# Patient Record
Sex: Male | Born: 1960
Health system: Southern US, Community
[De-identification: ages and names within clinical notes are randomized; demographics above are authoritative.]

## PROBLEM LIST (undated history)

## (undated) DIAGNOSIS — K219 Gastro-esophageal reflux disease without esophagitis: Secondary | ICD-10-CM

## (undated) DIAGNOSIS — E785 Hyperlipidemia, unspecified: Secondary | ICD-10-CM

## (undated) DIAGNOSIS — A4902 Methicillin resistant Staphylococcus aureus infection, unspecified site: Secondary | ICD-10-CM

## (undated) DIAGNOSIS — G4730 Sleep apnea, unspecified: Secondary | ICD-10-CM

## (undated) DIAGNOSIS — M47812 Spondylosis without myelopathy or radiculopathy, cervical region: Secondary | ICD-10-CM

## (undated) DIAGNOSIS — Z8719 Personal history of other diseases of the digestive system: Secondary | ICD-10-CM

## (undated) DIAGNOSIS — M1990 Unspecified osteoarthritis, unspecified site: Secondary | ICD-10-CM

## (undated) DIAGNOSIS — G629 Polyneuropathy, unspecified: Secondary | ICD-10-CM

## (undated) DIAGNOSIS — K760 Fatty (change of) liver, not elsewhere classified: Secondary | ICD-10-CM

## (undated) DIAGNOSIS — F419 Anxiety disorder, unspecified: Secondary | ICD-10-CM

## (undated) HISTORY — DX: Hyperlipidemia, unspecified: E78.5

## (undated) HISTORY — DX: Fatty (change of) liver, not elsewhere classified: K76.0

## (undated) HISTORY — DX: Polyneuropathy, unspecified: G62.9

## (undated) HISTORY — PX: BACK SURGERY: SHX140

## (undated) HISTORY — PX: GANGLION CYST EXCISION: SHX1691

## (undated) HISTORY — PX: OTHER SURGICAL HISTORY: SHX169

## (undated) HISTORY — DX: Spondylosis without myelopathy or radiculopathy, cervical region: M47.812

---

## 1997-10-11 ENCOUNTER — Ambulatory Visit (HOSPITAL_BASED_OUTPATIENT_CLINIC_OR_DEPARTMENT_OTHER): Admission: RE | Admit: 1997-10-11 | Discharge: 1997-10-11 | Payer: Self-pay | Admitting: Orthopedic Surgery

## 1998-01-08 ENCOUNTER — Ambulatory Visit (HOSPITAL_BASED_OUTPATIENT_CLINIC_OR_DEPARTMENT_OTHER): Admission: RE | Admit: 1998-01-08 | Discharge: 1998-01-08 | Payer: Self-pay | Admitting: Surgery

## 2001-12-30 ENCOUNTER — Encounter: Payer: Self-pay | Admitting: Internal Medicine

## 2001-12-30 ENCOUNTER — Ambulatory Visit (HOSPITAL_COMMUNITY): Admission: RE | Admit: 2001-12-30 | Discharge: 2001-12-30 | Payer: Self-pay | Admitting: Internal Medicine

## 2002-04-05 ENCOUNTER — Inpatient Hospital Stay (HOSPITAL_COMMUNITY): Admission: EM | Admit: 2002-04-05 | Discharge: 2002-04-06 | Payer: Self-pay

## 2002-04-06 ENCOUNTER — Encounter: Payer: Self-pay | Admitting: Internal Medicine

## 2003-10-08 ENCOUNTER — Ambulatory Visit (HOSPITAL_COMMUNITY): Admission: RE | Admit: 2003-10-08 | Discharge: 2003-10-08 | Payer: Self-pay | Admitting: *Deleted

## 2003-10-16 ENCOUNTER — Ambulatory Visit (HOSPITAL_COMMUNITY): Admission: RE | Admit: 2003-10-16 | Discharge: 2003-10-16 | Payer: Self-pay | Admitting: *Deleted

## 2005-01-27 ENCOUNTER — Emergency Department (HOSPITAL_COMMUNITY): Admission: EM | Admit: 2005-01-27 | Discharge: 2005-01-27 | Payer: Self-pay | Admitting: Family Medicine

## 2005-05-27 ENCOUNTER — Emergency Department (HOSPITAL_COMMUNITY): Admission: EM | Admit: 2005-05-27 | Discharge: 2005-05-27 | Payer: Self-pay | Admitting: Family Medicine

## 2005-06-10 ENCOUNTER — Emergency Department (HOSPITAL_COMMUNITY): Admission: EM | Admit: 2005-06-10 | Discharge: 2005-06-10 | Payer: Self-pay | Admitting: Family Medicine

## 2005-06-30 ENCOUNTER — Ambulatory Visit: Payer: Self-pay | Admitting: Infectious Diseases

## 2006-07-28 ENCOUNTER — Ambulatory Visit (HOSPITAL_COMMUNITY): Admission: RE | Admit: 2006-07-28 | Discharge: 2006-07-29 | Payer: Self-pay | Admitting: Neurosurgery

## 2007-04-13 ENCOUNTER — Ambulatory Visit (HOSPITAL_COMMUNITY): Admission: RE | Admit: 2007-04-13 | Discharge: 2007-04-13 | Payer: Self-pay | Admitting: Gastroenterology

## 2007-05-06 ENCOUNTER — Encounter: Admission: RE | Admit: 2007-05-06 | Discharge: 2007-05-06 | Payer: Self-pay | Admitting: Neurosurgery

## 2007-05-07 ENCOUNTER — Encounter: Admission: RE | Admit: 2007-05-07 | Discharge: 2007-05-07 | Payer: Self-pay | Admitting: Neurosurgery

## 2007-06-28 ENCOUNTER — Emergency Department (HOSPITAL_COMMUNITY): Admission: EM | Admit: 2007-06-28 | Discharge: 2007-06-28 | Payer: Self-pay | Admitting: Emergency Medicine

## 2007-06-29 ENCOUNTER — Emergency Department (HOSPITAL_COMMUNITY): Admission: EM | Admit: 2007-06-29 | Discharge: 2007-06-29 | Payer: Self-pay | Admitting: Emergency Medicine

## 2007-11-19 ENCOUNTER — Emergency Department (HOSPITAL_COMMUNITY): Admission: EM | Admit: 2007-11-19 | Discharge: 2007-11-19 | Payer: Self-pay | Admitting: Emergency Medicine

## 2007-12-13 ENCOUNTER — Encounter: Admission: RE | Admit: 2007-12-13 | Discharge: 2007-12-13 | Payer: Self-pay | Admitting: Neurosurgery

## 2008-01-02 ENCOUNTER — Encounter: Admission: RE | Admit: 2008-01-02 | Discharge: 2008-01-02 | Payer: Self-pay | Admitting: Neurosurgery

## 2008-03-14 ENCOUNTER — Ambulatory Visit (HOSPITAL_COMMUNITY): Admission: RE | Admit: 2008-03-14 | Discharge: 2008-03-15 | Payer: Self-pay | Admitting: Neurosurgery

## 2008-07-27 ENCOUNTER — Encounter: Admission: RE | Admit: 2008-07-27 | Discharge: 2008-07-27 | Payer: Self-pay | Admitting: Neurosurgery

## 2008-07-28 ENCOUNTER — Encounter: Admission: RE | Admit: 2008-07-28 | Discharge: 2008-07-28 | Payer: Self-pay | Admitting: Neurosurgery

## 2008-12-12 ENCOUNTER — Encounter: Admission: RE | Admit: 2008-12-12 | Discharge: 2008-12-12 | Payer: Self-pay | Admitting: Neurosurgery

## 2010-05-27 ENCOUNTER — Ambulatory Visit (HOSPITAL_COMMUNITY)
Admission: RE | Admit: 2010-05-27 | Discharge: 2010-05-27 | Payer: Self-pay | Source: Home / Self Care | Attending: Gastroenterology | Admitting: Gastroenterology

## 2010-10-14 NOTE — Op Note (Signed)
NAMEDVANTE, HANDS                ACCOUNT NO.:  [id number]   MEDICAL RECORD NO.:  [id number]          PATIENT TYPE:  AMB   LOCATION:  ENDO                         FACILITY:  [facility]   PHYSICIAN:  Danise Edge, M.D.   DATE OF BIRTH:  September 12, 1960   DATE OF PROCEDURE:  04/13/2007  DATE OF DISCHARGE:                               OPERATIVE REPORT   PROCEDURE PERFORMED:  Esophagogastroduodenoscopy.   PROCEDURE INDICATIONS:  Mr. [name] is a 50 year old male born  1960/11/12.  [name] has chronic heartburn.  He is currently  taking Protonix 40 mg each morning.  Despite taking Protonix each  morning, he occasionally has breakthrough heartburn and has  regurgitation.  He reports no dysphagia or odynophagia.   MEDICATIONS ALLERGIES:  VOLTAREN.   CHRONIC MEDICATIONS:  Protonix, multivitamin, calcium, Xanax.   PAST MEDICAL/SURGICAL HISTORY:  1. Gastroesophageal reflux.  2. Atypical chest pain with normal Cardiolite stress test 2003.  3. History of seizure 20 years ago after trauma.  4. Mild dyslipidemia.  5. Chronic headaches.  6. Cervical disk disease.  7. C5-C6 diskectomy.  8. Right ear surgery.  9. Varicocele surgery.  10.Ganglion cyst removed from the left wrist.  11.Lipoma.  12.Benign moles.  13.Ulnar nerve surgery on the left arm.   ENDOSCOPIST:  Danise Edge, M.D.   PREMEDICATION:  Fentanyl 100 mcg, Versed 7.5 mg.   PROCEDURE:  After obtaining informed consent, [name] was placed in  the left lateral decubitus position.  I administered intravenous  fentanyl and intravenous Versed to achieve conscious sedation for the  procedure.  The patient's blood pressure, oxygen saturation and cardiac  rhythm were monitored throughout the procedure and documented in the  medical record.   The Pentax gastroscope was passed through the posterior hypopharynx into  the proximal esophagus without difficulty.  The hypopharynx, larynx and  vocal cords appeared normal.   Esophagoscopy:  The proximal mid and lower segments of the esophageal  mucosa appeared normal.  [name] has a small hiatal hernia.  The  esophagogastric junction is noted at approximately 36 cm from the  incisor teeth.  There is no endoscopic evidence for the presence of  Barrett's esophagus, erosive esophagitis or esophageal mucosal scarring.   Gastroscopy:  Retroflex view of the gastric cardia and fundus was  normal.  The diaphragmatic hiatus was slightly patulous.  The gastric  body, antrum and pylorus appear normal.   Duodenoscopy:  The duodenal bulb and descending duodenum appeared  normal.   ASSESSMENT:  Normal esophagogastroduodenoscopy except for the presence  of a small hiatal hernia and slightly patulous diaphragmatic hiatus.   RECOMMENDATIONS:  [name] will try taking his Protonix before  breakfast and supper to control his reflux symptoms.  He is not  interested in acid reflux surgery to control his regurgitation.           ______________________________  Danise Edge, M.D.     MJ/MEDQ  D:  04/13/2007  T:  04/14/2007  Job:  [id number]   cc:   Georgann Housekeeper, MD

## 2010-10-14 NOTE — Op Note (Signed)
NAMEJAYR, [name]                ACCOUNT NO.:  [id number]   MEDICAL RECORD NO.:  [id number]          PATIENT TYPE:  OIB   LOCATION:  3528                         FACILITY:  [facility]   PHYSICIAN:  Hewitt Shorts, M.D.DATE OF BIRTH:  [birth year]   DATE OF PROCEDURE:  03/14/2008  DATE OF DISCHARGE:                               OPERATIVE REPORT   PREOPERATIVE DIAGNOSES:  1. C5-C6, C6-C7 pseudoarthrosis.  2. Neck pain.   POSTOPERATIVE DIAGNOSES:  1. C5-C6, C6-C7 pseudoarthrosis.  2. Neck pain.   PROCEDURE:  C5-C7 posterior cervical arthrodesis with lateral mass  screws and rods and bone graft and Infuse.   SURGEON:  Hewitt Shorts, MD   ASSISTANT:  Coletta Memos, MD   ANESTHESIA:  General endotracheal.   INDICATIONS:  The patient is a 50 year old man.  He is status post 2  level C5-6 and C6-7 anterior cervical decompression arthrodesis in  February 2008.  Unfortunately, he developed nonunion of both C5-6 and C6-  7 levels.  These were treated with calcium with vitamin D as well as  Miacalcin nasal spray without improvement and a decision was made to  proceed with a posterior cervical arthrodesis.   PROCEDURE:  The patient was brought to the operating room and placed  under general endotracheal anesthesia.  The patient had a 3-pin Mayfield  head holder applied.  The patient was turned to a prone position.  The  posterior cervical region was prepped with Betadine soap and solution  and draped in a sterile fashion.  The midline was infiltrated with local  anesthetic with epinephrine and the C6 and C7 spinous process was  identified by palpation and a midline incision was made over the lower  cervical region and carried down through the subcutaneous tissue.  Bipolar cautery and electrocautery used to maintain hemostasis.  Dissection was carried down to the cervical fascia, which was incised  bilaterally and the paracervical musculature was dissected from the  spinous process  and lamina in a subperiosteal fashion.  A self-retaining  retractor was placed, and a C-arm fluoroscope was draped and brought  onto the field to provide additional localization.  Even with the C-arm  fluoroscope the patient's shoulders did somewhat obscure the overall  visualization, but we were able to count down from C2 and identify the  C5, C6, and C7 spinous processes and there by the lamina and facets and  lateral masses.  Using C-arm fluoroscopic guidance, we then placed the  lateral mass screws taking entry sites bilaterally at each level.  The  entry site was stated with an awl and then we used a 2.5-mm drill bit,  each drill hole into the lateral mass was examined with a ball probe and  good bony surfaces were found.  Each screw hole was tapped with tap and  then we placed 3.5 mm screws placing 12-mm screws at C5 and 14-mm screws  at C6 and C7.  We used an Oasis lateral mass screw and rod set.  We used  a 50-mm rod on the right and a 40-mm rod on the left.  Each  was lordosed  with the rod bender and placed within the screw heads.  Locking caps  were placed and they were tightened against the counter torque.  We then  decorticated the facet joints and lamina of C5, C6, and C7 and using a  small Infuse, a pledget was placed on each side over the decorticated  bony surface and then we  used mosaic, which was packed into the facet  joints and over the lamina from C5 to C7.  We then proceeded with  closure.  The deep fascia closed with interrupted undyed 1-0 Vicryl  sutures.  Scarpa fascia was closed with interrupted undyed #1 Vicryl  sutures.  The subcutaneous and subcuticular were closed with interrupted  inverted 2-0 undyed Vicryl sutures.  The skin was closed with surgical  staples.  The wound was dressed with Adaptic and sterile gauze.  The  procedure was tolerated well.  The estimated blood loss was 100 mL.  Sponge and needle count were correct.  Following surgery, the patient   was returned back to supine position.  The 3-pin Mayfield head holder  was removed.  The patient was then reversed from the anesthetic,  extubated, and transferred to the recovery room for further care where  he was noted moving all 4 extremities to command.      Hewitt Shorts, M.D.  Electronically Signed     RWN/MEDQ  D:  03/14/2008  T:  03/15/2008  Job:  [id number]

## 2010-10-17 NOTE — Op Note (Signed)
NAMEORLANDO, DEVEREUX                ACCOUNT NO.:  [id number]   MEDICAL RECORD NO.:  [id number]          PATIENT TYPE:  AMB   LOCATION:  SDS                          FACILITY:  [facility]   PHYSICIAN:  Hewitt Shorts, M.D.DATE OF BIRTH:  1960/12/09   DATE OF PROCEDURE:  07/28/2006  DATE OF DISCHARGE:                               OPERATIVE REPORT   PREOPERATIVE DIAGNOSES:  C5-6 and C6-7 cervical disk herniation,  cervical spondylosis, cervical degenerative disk disease, and cervical  radiculopathy.   POSTOPERATIVE DIAGNOSIS:  C5-6 and C6-7 cervical disk herniation,  cervical spondylosis, cervical degenerative disk disease, and cervical  radiculopathy.   PROCEDURE:  C5-6 and C6-7 anterior cervical discectomy and arthrodesis  with allograft and Tether cervical plating.   SURGEON:  Hewitt Shorts, M.D.   ASSISTANT:  Jerald Kief, RN and Phoebe Perch.   ANESTHESIA:  General endotracheal.   INDICATION:  The patient is a 50 year old man who presented with left  cervical radiculopathy.  He was found by x-rays and MRI scan to have  degenerative disk disease and spondylosis.  MRI revealed superimposed  spondylosis with cervical disk herniation.  The decision was made to  proceed with a two-level anterior cervical discectomy and arthrodesis.   PROCEDURE:  The patient brought to the operating room and placed under  general endotracheal anesthesia.  The patient was placed in 10 pounds of  Holter traction and the neck was prepped with Betadine and soap solution  and draped in a sterile fashion.  A horizontal incision was made in the  left side of the neck.  The line of the incision was infiltrated with  local anesthetic with epinephrine.  Dissection was carried down through  the subcutaneous tissue and platysma.  Bipolar cautery was used to  maintain hemostasis.  Dissection was then carried out through an  avascular plane leaving the sternocleidomastoid, carotid artery, and  jugular vein laterally,  and the trachea and esophagus medially.  The  ventral aspect of the vertebral column was identified and a localizing x-  ray taken and the C5-6 and C6-7 intervertebral disk space was  identified.  Discectomy was begun with incision of the annulus and  continued with micro curets and pituitary rongeurs.  The microscope was  draped and brought into the field to provide additional magnification,  illumination, and visualization.  The remainder of decompression was  performed using microdissection microsurgical technique.  Anterior  osteophytic overgrowth was removed using the 3-mm Kerrison punch, as  well as the osteophyte removal tool.  The discectomy was continued  posteriorly through the disk space at each level with removal of the  cartilaginous end plates using micro curets, along with the XMax drill  and then posterior osteophytic overgrowth was removed using the XMax  drill and Kerrison punch with a thin footplate.  The posterior  longitudinal ligament was carefully opened at each level and removed and  then disk herniation was removed and attention was then paid to the  neural foramina laterally at each level.  This spondylitic overgrowth  and disk herniation was removed and we were able to decompress the  exiting nerve roots at each level bilaterally.  Once the decompression  was completed, hemostasis was established with the use of Gelfoam soaked  in thrombin and then we measured the height of the intervertebral disk  space and selected two 7-mm intervertebral implants.  Each was hydrated  in saline solution and positioned in the intervertebral disk space and  countersunk.  Once each graft was in place, the traction was  discontinued and then we selected a 32-mm Tether cervical plate that was  positioned over the fusion construct and secured to the vertebra with 4  x 14-mm variable angle screws, placing a pair of screws at C5, another  pair at C7, and a single screw at C6.  Each  screw hole was drilled and  tapped and the screws were placed in an alternating fashion.  Once all 5  screws were placed, the final tightening was performed.  We then took an  x-ray.  Unfortunately, because of his large shoulders, visualization of  the construct was limited.  Screws were in good position at C5, graft  appeared in good position at the C5-6 level.  However, under direct  visualization, the implants looked in good position.  The wound was  irrigated with bacitracin solution, checked for hemostasis which was  established and then we proceeded with closure.  The platysma was closed  with interrupted inverted 2-0 Vicryl suture.  The subcutaneous and  subcuticular were closed with interrupted inverted 3-0 Vicryl sutures.  The skin was reapproximated with Dermabond.  The procedure was tolerated  well.  The estimated blood loss was 50 mL.  Sponge and needle count were  correct.  Following surgery, the patient is to be reversed from  anesthetic, extubated, and transferred to the recovery room for further  care.      Hewitt Shorts, M.D.  Electronically Signed     RWN/MEDQ  D:  07/28/2006  T:  07/28/2006  Job:  [id number]

## 2010-10-17 NOTE — Consult Note (Signed)
[name], [name]                          ACCOUNT NO.:  [id number]   MEDICAL RECORD NO.:  [id number]                   PATIENT TYPE:  INP   LOCATION:  2002                                 FACILITY:  [facility]   PHYSICIAN:  Meade Maw, M.D.                 DATE OF BIRTH:  1960-06-18   DATE OF CONSULTATION:  04/05/2002  DATE OF DISCHARGE:                                   CONSULTATION   REASON FOR CONSULTATION:  Chest pain.   HISTORY OF PRESENT ILLNESS:  The patient is a 50 year old gentleman  previously evaluated by Dr. Amil Amen in 1997 for chest pain.  He had an  abnormal GX2, which was subsequently followed by an adenosine Cardiolite.  The patient notes a six-month history of left anterior sharp transient chest  pain.  There have been no aggravating or alleviating factors noted.  The  patient notes that the chest pain was fleeting in sensation.  Yesterday he  noted a different sort of chest pain, which was described as a slashing  chest pain.  The pain recurred today, and the patient felt totally exhausted  and fatigued.  He had nausea but no vomiting and positive diaphoresis.  He  felt that this pain was different from his usual GERD symptoms and  subsequently presented to the emergency room for further evaluation.   PAST MEDICAL HISTORY:  1. GERD.  He has had a negative upper GI in the past.  2. Chest pain October 1997.  Adenosine Cardiolite negative.  3. Dyslipidemia.  4. History of seizure disorder following a head trauma.  5. Frequent headaches with a negative MRI in the past.   PAST SURGICAL HISTORY:  Significant for ear surgery.   MEDICATIONS:  Current medications include Prilosec 20 mg daily,  multivitamin, and Excedrin p.r.n.  His current medications include Protonix  40 mg daily, aspirin 325 mg daily, Tylenol p.r.n., Ambien 5 mg p.r.n.,  sublingual nitroglycerin p.r.n.   ALLERGIES:  VOLTAREN.   SOCIAL HISTORY:  Continues to smoke two packs per day.  Twelve  beers per  week.   FAMILY HISTORY:  Significant for Father passing with myocardial infarction  at age 63.  Mother passed at age 64 from congestive heart failure.   REVIEW OF SYSTEMS:  He notes episodic dizziness.  There has been no  dysphagia.  There has been no pedal edema, no orthopnea, no  tachyarrhythmias.   PHYSICAL EXAMINATION:  VITAL SIGNS:  Blood pressure is 134/81, heart rate  70.  He is afebrile.  HEENT:  Unremarkable.  NECK:  He has good carotid upstrokes.  No carotid bruit is noted.  No neck  vein distention is noted.  CHEST:  Bibasilar crackles.  Pulmonary exam otherwise clear to auscultation.  CARDIAC:  Regular rate and rhythm, no rubs, murmurs, or gallops noted.  ABDOMEN:  Soft, benign, nontender.  No bruits are noted.  EXTREMITIES:  Distal  pulses which are equal and intact.  NEUROLOGIC:  Nonfocal.  Motor is 5/5 throughout.   LABORATORY DATA:  Chest x-ray reveals no acute disease.  ECG reveals J-point  elevation consistent with early repolarization.   His white count is 6.5, hematocrit 46, platelet count is 177.  Creatinine  0.9.  Initial cardiac enzymes are negative.   IMPRESSION:  [name] with atypical chest pain but  significant risk factors, including family history, tobacco use.  Will  proceed with a stress Cardiolite for further evaluation.  Would continue  with aspirin 325 mg daily.  Should he have recurrent chest pain, would  consider initiation of heparin or Lovenox.  As he has had no EKG changes and  his initial CKs are negative, will hold on anticoagulation at this point.                                               Meade Maw, M.D.    HP/MEDQ  D:  04/05/2002  T:  04/06/2002  Job:  [id number]   cc:   Georgann Housekeeper, M.D.  [address]  [place]  [place] [address]  Fax: [phone]   Francisca December, M.D.  [address]  [place]  [place] [address]  Fax: [phone]

## 2010-10-17 NOTE — H&P (Signed)
[name], [name]                            ACCOUNT NO.:  [id number]   MEDICAL RECORD NO.:  [id number]                  PATIENT TYPE:   LOCATION:                                       FACILITY:   PHYSICIAN:  Georgann Housekeeper, M.D.                 DATE OF BIRTH:  January 07, 1961   DATE OF ADMISSION:  04/05/2002  DATE OF DISCHARGE:                                HISTORY & PHYSICAL   CHIEF COMPLAINT:  Chest pain.   HISTORY OF PRESENT ILLNESS:  This 50 year old male with history of  gastroesophageal reflux disease and tobacco use comes in complaining of this  substernal chest pain. Describes as sharp and pulse-like.  Happened this  morning about 8/10.  Episodes last a few seconds 10 to 12 times.  It was  while walking.  After that he felt flushed and a little sweaty and some  shortness of breath.  He came to the emergency room.  He did not have any  pain here.  No palpitations.  He also reports that two days ago he had chest  pain across his chest while lying watching TV, but he did not have any  shortness of breath.  His left arm and shoulder have been aching since last  night. Some nausea.  No vomiting.  No fevers. No cough.  No URI symptoms.  He states that his symptoms with this has been a little different than his  usual acid reflux symptoms which seems to be well controlled with Prilosec  although he said that since he has been taking the over-the-counter Prilosec  he feels that it is not holding 24 hours which he takes daily.   PAST MEDICAL HISTORY:  1. Acid reflux disease.  2. Atypical chest pain 9/97  with stress test Cardiolite negative.   MEDICATIONS:  1. Prilosec over-the-counter 20 mg.  2. Vitamins.   ALLERGIES:  VOLTAREN.   PAST SURGICAL HISTORY:  None.   SOCIAL HISTORY:  He smokes two pack a day for 25 years.  Alcohol:  None.  No  recreational drugs. Caffeine:  One to two per day.   FAMILY HISTORY:  Father died of MI at age 68. Mother died of emphysema and  CHF  in her 70s.  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  Married.   REVIEW OF SYSTEMS:  As above.   PHYSICAL EXAMINATION:  VITAL SIGNS:  Temperature 97, blood pressure 117/80,  pulse 60.  Saturation 100%.  GENERAL:  Well-appearing male in no acute distress.  He denies any chest  pain or difficulty breathing.  HEENT:  Pupils reactive.  Oropharynx was clear.  NECK:  Supple.  CARDIAC:  Regular rhythm S1, S2 without any murmurs or gallops.  LUNGS:  Clear.  ABDOMEN:  Soft without any tenderness.  Positive bowel sounds.  EXTREMITIES:  No edema.   Chest x-ray  was negative.  EKG normal sinus rhythm without any STT wave  changes.   LABORATORY DATA:  CPK of 51, troponin 0.01. MB was normal.  White  count  6.5, hemoglobin 15.  Chemistries are normal with glucose 97.   ASSESSMENT:  A 50 year old male with chest pain symptoms, atypical.  History  of gastroesophageal reflux disease.  Positive risk factors.  Family history  as well as tobacco.  Stress test six years ago was negative.   PLAN:  Admit for telemetry.  Rule myocardial infarction out and get a stress  test with cardiology.  I would hold on nitroglycerin at  this point because  he has no pain.  Continue with aspirin.  As far as acid reflux disease,  Protonix 40 mg q.d.                                               Georgann Housekeeper, M.D.    KH/MEDQ  D:  04/05/2002  T:  04/05/2002  Job:  [id number]

## 2010-10-20 ENCOUNTER — Other Ambulatory Visit (HOSPITAL_COMMUNITY): Payer: Self-pay | Admitting: Neurosurgery

## 2010-10-20 DIAGNOSIS — M542 Cervicalgia: Secondary | ICD-10-CM

## 2010-11-14 ENCOUNTER — Ambulatory Visit (HOSPITAL_COMMUNITY)
Admission: RE | Admit: 2010-11-14 | Discharge: 2010-11-14 | Disposition: A | Discharge status: Home / Self Care | Payer: 59 | Source: Ambulatory Visit | Attending: Neurosurgery | Admitting: Neurosurgery

## 2010-11-14 DIAGNOSIS — M4802 Spinal stenosis, cervical region: Secondary | ICD-10-CM | POA: Insufficient documentation

## 2010-11-14 DIAGNOSIS — M542 Cervicalgia: Secondary | ICD-10-CM

## 2010-11-14 DIAGNOSIS — Z01818 Encounter for other preprocedural examination: Secondary | ICD-10-CM | POA: Insufficient documentation

## 2011-02-10 ENCOUNTER — Ambulatory Visit (HOSPITAL_COMMUNITY)
Admission: RE | Admit: 2011-02-10 | Discharge: 2011-02-10 | Disposition: A | Discharge status: Home / Self Care | Payer: 59 | Source: Ambulatory Visit | Attending: Interventional Cardiology | Admitting: Interventional Cardiology

## 2011-02-10 DIAGNOSIS — R072 Precordial pain: Secondary | ICD-10-CM | POA: Insufficient documentation

## 2011-02-19 LAB — URINE MICROSCOPIC-ADD ON

## 2011-02-19 LAB — URINALYSIS, ROUTINE W REFLEX MICROSCOPIC
Glucose, UA: NEGATIVE
Leukocytes, UA: NEGATIVE
Leukocytes, UA: NEGATIVE
Nitrite: NEGATIVE
Specific Gravity, Urine: 1.01
Urobilinogen, UA: 0.2
Urobilinogen, UA: 0.2

## 2011-02-19 LAB — BASIC METABOLIC PANEL
Calcium: 8.9
Chloride: 105
Creatinine, Ser: 1.06
GFR calc Af Amer: >60
GFR calc non Af Amer: >60

## 2011-02-19 LAB — DIFFERENTIAL
Lymphocytes Relative: 13
Lymphs Abs: 1.1
Monocytes Relative: 6
Neutro Abs: 7.1
Neutrophils Relative %: 80 — ABNORMAL HIGH

## 2011-02-19 LAB — CBC
RBC: 4.48
WBC: 8.9

## 2011-03-02 LAB — CBC
HCT: 46
Hemoglobin: 15.6
MCHC: 34
MCV: 94.3
Platelets: 211
RBC: 4.87
RDW: 12.8
WBC: 6.1

## 2011-06-08 ENCOUNTER — Other Ambulatory Visit: Payer: Self-pay | Admitting: Internal Medicine

## 2011-06-08 DIAGNOSIS — R1011 Right upper quadrant pain: Secondary | ICD-10-CM

## 2011-06-09 ENCOUNTER — Other Ambulatory Visit: Payer: Self-pay | Admitting: Internal Medicine

## 2011-06-09 ENCOUNTER — Ambulatory Visit
Admission: RE | Admit: 2011-06-09 | Discharge: 2011-06-09 | Disposition: A | Discharge status: Home / Self Care | Payer: 59 | Source: Ambulatory Visit | Attending: Internal Medicine | Admitting: Internal Medicine

## 2011-06-09 DIAGNOSIS — R1011 Right upper quadrant pain: Secondary | ICD-10-CM

## 2011-08-10 ENCOUNTER — Encounter (HOSPITAL_COMMUNITY): Payer: Self-pay

## 2011-08-10 ENCOUNTER — Ambulatory Visit (HOSPITAL_COMMUNITY)
Admission: RE | Admit: 2011-08-10 | Discharge: 2011-08-10 | Disposition: A | Discharge status: Home / Self Care | Payer: 59 | Source: Ambulatory Visit | Attending: Gastroenterology | Admitting: Gastroenterology

## 2011-08-10 HISTORY — DX: Unspecified osteoarthritis, unspecified site: M19.90

## 2011-08-10 HISTORY — DX: Sleep apnea, unspecified: G47.30

## 2011-08-10 HISTORY — DX: Gastro-esophageal reflux disease without esophagitis: K21.9

## 2011-08-10 HISTORY — DX: Personal history of other diseases of the digestive system: Z87.19

## 2011-08-10 HISTORY — DX: Anxiety disorder, unspecified: F41.9

## 2011-08-10 HISTORY — DX: Methicillin resistant Staphylococcus aureus infection, unspecified site: A49.02

## 2011-08-10 NOTE — Pre-Procedure Instructions (Signed)
Patient instructed to take protonix ,pepcid and xanax with sip of water in early am morning of procedure.

## 2011-08-10 NOTE — Anesthesia Preprocedure Evaluation (Addendum)
Anesthesia Evaluation  Patient identified by MRN, date of birth, ID band Patient awake  General Assessment Comment:S/p bilateral neck fusion Pt denies hx of MH  Reviewed: Allergy & Precautions, H&P , NPO status , Patient's Chart, lab work & pertinent test results, reviewed documented beta blocker date and time   Airway Mallampati: II TM Distance: >3 FB Neck ROM: Limited    Dental  (+) Teeth Intact   Pulmonary sleep apnea ,  OSA?, didn't go thru sleep study, uses external nasal "strip". Pt says strip helps and he uses it nightly breath sounds clear to auscultation        Cardiovascular negative cardio ROS  Rhythm:Regular Rate:Normal  Denies cardiac symptoms   Neuro/Psych negative neurological ROS  negative psych ROS   GI/Hepatic negative GI ROS, Neg liver ROS, hiatal hernia, GERD-  ,ABD pain   Endo/Other  negative endocrine ROS  Renal/GU negative Renal ROS  negative genitourinary   Musculoskeletal negative musculoskeletal ROS (+)   Abdominal   Peds negative pediatric ROS (+)  Hematology negative hematology ROS (+)   Anesthesia Other Findings   Reproductive/Obstetrics negative OB ROS                          Anesthesia Physical Anesthesia Plan  ASA: II  Anesthesia Plan: MAC   Post-op Pain Management:    Induction: Intravenous  Airway Management Planned: Mask  Additional Equipment:   Intra-op Plan:   Post-operative Plan:   Informed Consent:   Dental advisory given  Plan Discussed with:   Anesthesia Plan Comments:         Anesthesia Quick Evaluation

## 2011-08-11 ENCOUNTER — Encounter (HOSPITAL_COMMUNITY): Payer: Self-pay | Admitting: Anesthesiology

## 2011-08-11 ENCOUNTER — Ambulatory Visit (HOSPITAL_COMMUNITY): Payer: 59 | Admitting: Anesthesiology

## 2011-08-11 ENCOUNTER — Ambulatory Visit (HOSPITAL_COMMUNITY)
Admission: RE | Admit: 2011-08-11 | Discharge: 2011-08-11 | Disposition: A | Discharge status: Home / Self Care | Payer: 59 | Source: Ambulatory Visit | Attending: Gastroenterology | Admitting: Gastroenterology

## 2011-08-11 ENCOUNTER — Encounter (HOSPITAL_COMMUNITY)
Admission: RE | Disposition: A | Discharge status: Home / Self Care | Payer: Self-pay | Source: Ambulatory Visit | Attending: Gastroenterology

## 2011-08-11 ENCOUNTER — Encounter (HOSPITAL_COMMUNITY): Payer: Self-pay | Admitting: *Deleted

## 2011-08-11 DIAGNOSIS — K449 Diaphragmatic hernia without obstruction or gangrene: Secondary | ICD-10-CM | POA: Insufficient documentation

## 2011-08-11 DIAGNOSIS — M47812 Spondylosis without myelopathy or radiculopathy, cervical region: Secondary | ICD-10-CM | POA: Insufficient documentation

## 2011-08-11 DIAGNOSIS — K208 Other esophagitis without bleeding: Secondary | ICD-10-CM | POA: Insufficient documentation

## 2011-08-11 DIAGNOSIS — K2970 Gastritis, unspecified, without bleeding: Secondary | ICD-10-CM | POA: Insufficient documentation

## 2011-08-11 DIAGNOSIS — M19049 Primary osteoarthritis, unspecified hand: Secondary | ICD-10-CM | POA: Insufficient documentation

## 2011-08-11 DIAGNOSIS — R51 Headache: Secondary | ICD-10-CM | POA: Insufficient documentation

## 2011-08-11 DIAGNOSIS — E785 Hyperlipidemia, unspecified: Secondary | ICD-10-CM | POA: Insufficient documentation

## 2011-08-11 SURGERY — ESOPHAGOGASTRODUODENOSCOPY (EGD) WITH PROPOFOL
Anesthesia: Monitor Anesthesia Care

## 2011-08-11 MED ORDER — PROPOFOL 10 MG/ML IV EMUL
INTRAVENOUS | Status: DC | PRN
  Administered 2011-08-11: 40 mg via INTRAVENOUS
  Administered 2011-08-11: 20 mg via INTRAVENOUS
  Administered 2011-08-11: 30 mg via INTRAVENOUS

## 2011-08-11 MED ORDER — FENTANYL CITRATE 0.05 MG/ML IJ SOLN
INTRAMUSCULAR | Status: DC | PRN
  Administered 2011-08-11: 50 ug via INTRAVENOUS

## 2011-08-11 MED ORDER — BUTAMBEN-TETRACAINE-BENZOCAINE 2-2-14 % EX AERO
INHALATION_SPRAY | CUTANEOUS | Status: DC | PRN
  Administered 2011-08-11: 2 via TOPICAL

## 2011-08-11 MED ORDER — MIDAZOLAM HCL 5 MG/5ML IJ SOLN
INTRAMUSCULAR | Status: DC | PRN
  Administered 2011-08-11: 1 mg via INTRAVENOUS

## 2011-08-11 MED ORDER — LACTATED RINGERS IV SOLN
INTRAVENOUS | Status: DC
  Administered 2011-08-11: 1000 mL via INTRAVENOUS

## 2011-08-11 MED ORDER — PROPOFOL 10 MG/ML IV EMUL
INTRAVENOUS | Status: DC | PRN
  Administered 2011-08-11: 50 ug/kg/min via INTRAVENOUS

## 2011-08-11 SURGICAL SUPPLY — 15 items

## 2011-08-11 NOTE — Transfer of Care (Signed)
Immediate Anesthesia Transfer of Care Note  Patient: [name]  Procedure(s) Performed: Procedure(s) (LRB): ESOPHAGOGASTRODUODENOSCOPY (EGD) WITH PROPOFOL (N/A)  Patient Location: Endoscopy Unit  Anesthesia Type: MAC  Level of Consciousness: awake, alert  and oriented  Airway & Oxygen Therapy: Patient Spontanous Breathing and Patient connected to nasal cannula oxygen  Post-op Assessment: Report given to PACU RN and Post -op Vital signs reviewed and stable  Post vital signs: Reviewed and stable  Complications: No apparent anesthesia complications

## 2011-08-11 NOTE — Op Note (Signed)
[facility] [address] [place], [place]  [address]  ENDOSCOPY PROCEDURE REPORT  PATIENT:  [name], [name]  MR#:  [id number] BIRTHDATE:  24-May-1961, 50 yrs. old  GENDER:  male  ENDOSCOPIST:  Willis Modena, MD Referred by:  Georgann Housekeeper, M.D.  PROCEDURE DATE:  08/11/2011 PROCEDURE:  EGD with biopsy, [id number] ASA CLASS:  Class II INDICATIONS:  abdominal pain, GERD  MEDICATIONS:  Cetacaine spray x 2, MAC sedation, administered by CRNA  DESCRIPTION OF PROCEDURE:   After the risks benefits and alternatives of the procedure were thoroughly explained, informed consent was obtained.  The Pentax [id number] 3.2 [id number] endoscope was introduced through the mouth and advanced to the second portion of the duodenum, without limitations.  The instrument was slowly withdrawn as the mucosa was fully examined.  <<PROCEDUREIMAGES>>  FINDINGS:  LA-B distal erosive esophagitis seen; no obvious mass, varices, stricture or Barrett's mucosa seen.  Small hiatal hernia. Mild pangastritis, biopsied, otherwise normal stomach.  Normal pylorus.  Normal duodenum to the second portion; random biopsies of normal-appearing duodenum taken to evaluate for celiac sprue.  COMPLICATIONS:  None  ENDOSCOPIC IMPRESSION:    1.  Small hiatal hernia. 2.  Distal erosive esophagitis.  Perhaps this was induced by recent increase in NSAID use. 3.  Mild gastritis, biopsied. 4.  Otherwise normal endoscopy.  RECOMMENDATIONS:      1.  Watch for potential complications of procedure. 2.  Await biopsy  results. 3.  Conservative antireflux measures, especially NSAID avoidance. 4.  Continue Protonix and Pepcid for now, pending biopsy results. 5.  Follow-up office visit with me in 3-4 weeks.  REPEAT EXAM:  No  ______________________________ Willis Modena  CC:  n. eSIGNEDWillis Modena at 08/11/2011 07:58 AM  Emmit Alexanders, [id number]

## 2011-08-11 NOTE — H&P (Signed)
Patient interval history reviewed.  Patient examined again.  There has been no change from documented H/P dated 08/06/11 (scanned into chart from our office) except as documented above.  Risks (bleeding, infection, bowel perforation that could require surgery, sedation-related changes in cardiopulmonary systems), benefits (identification and possible treatment of source of symptoms, exclusion of certain causes of symptoms), and alternatives (watchful waiting, radiographic imaging studies, empiric medical treatment) of upper endoscopy (EGD) were explained to patient/family in detail and patient wishes to proceed.

## 2011-08-11 NOTE — Anesthesia Postprocedure Evaluation (Signed)
  Anesthesia Post-op Note  Patient: [name]  Procedure(s) Performed: Procedure(s) (LRB): ESOPHAGOGASTRODUODENOSCOPY (EGD) WITH PROPOFOL (N/A)  Patient Location: PACU  Anesthesia Type: MAC  Level of Consciousness: awake and alert   Airway and Oxygen Therapy: Patient Spontanous Breathing  Post-op Pain: mild  Post-op Assessment: Post-op Vital signs reviewed, Patient's Cardiovascular Status Stable, Respiratory Function Stable, Patent Airway and No signs of Nausea or vomiting  Post-op Vital Signs: stable  Complications: No apparent anesthesia complications

## 2011-08-11 NOTE — Preoperative (Signed)
Beta Blockers   Reason not to administer Beta Blockers:Not Applicable 

## 2011-08-11 NOTE — Discharge Instructions (Signed)
Endoscopy °Care After °Please read the instructions outlined below and refer to this sheet in the next few weeks. These discharge instructions provide you with general information on caring for yourself after you leave the hospital. Your doctor may also give you specific instructions. While your treatment has been planned according to the most current medical practices available, unavoidable complications occasionally occur. If you have any problems or questions after discharge, please call Dr. Jency Schnieders ([facility]) at [phone]. ° °HOME CARE INSTRUCTIONS °Activity °· You may resume your regular activity but move at a slower pace for the next 24 hours.  °· Take frequent rest periods for the next 24 hours.  °· Walking will help expel (get rid of) the air and reduce the bloated feeling in your abdomen.  °· No driving for 24 hours (because of the anesthesia (medicine) used during the test).  °· You may shower.  °· Do not sign any important legal documents or operate any machinery for 24 hours (because of the anesthesia used during the test).  °Nutrition °· Drink plenty of fluids.  °· You may resume your normal diet.  °· Begin with a light meal and progress to your normal diet.  °· Avoid alcoholic beverages for 24 hours or as instructed by your caregiver.  °Medications °You may resume your normal medications unless your caregiver tells you otherwise. °What you can expect today °· You may experience abdominal discomfort such as a feeling of fullness or "gas" pains.  °· You may experience a sore throat for 2 to 3 days. This is normal. Gargling with salt water may help this.  °·  °SEEK IMMEDIATE MEDICAL CARE IF: °· You have excessive nausea (feeling sick to your stomach) and/or vomiting.  °· You have severe abdominal pain and distention (swelling).  °· You have trouble swallowing.  °· You have a temperature over 100° F (37.8° C).  °· You have rectal bleeding or vomiting of blood.  °Document Released:  12/31/2003 Document Revised: 01/28/2011 Document Reviewed: 07/13/2007 °ExitCare® Patient Information ©2012 [employer]. °

## 2013-04-18 ENCOUNTER — Other Ambulatory Visit: Payer: Self-pay | Admitting: Internal Medicine

## 2013-04-18 ENCOUNTER — Ambulatory Visit
Admission: RE | Admit: 2013-04-18 | Discharge: 2013-04-18 | Disposition: A | Discharge status: Home / Self Care | Payer: 59 | Source: Ambulatory Visit | Attending: Internal Medicine | Admitting: Internal Medicine

## 2013-04-18 DIAGNOSIS — R0602 Shortness of breath: Secondary | ICD-10-CM

## 2013-05-22 ENCOUNTER — Encounter (INDEPENDENT_AMBULATORY_CARE_PROVIDER_SITE_OTHER): Payer: Self-pay

## 2013-05-22 ENCOUNTER — Encounter: Payer: Self-pay | Admitting: Internal Medicine

## 2013-05-22 ENCOUNTER — Ambulatory Visit (INDEPENDENT_AMBULATORY_CARE_PROVIDER_SITE_OTHER): Payer: 59 | Admitting: Internal Medicine

## 2013-05-22 VITALS — BP 114/82 | HR 76 | Temp 97.9°F | Ht 71.0 in | Wt 199.0 lb

## 2013-05-22 DIAGNOSIS — J449 Chronic obstructive pulmonary disease, unspecified: Secondary | ICD-10-CM

## 2013-05-22 DIAGNOSIS — R0789 Other chest pain: Secondary | ICD-10-CM

## 2013-05-22 DIAGNOSIS — R0609 Other forms of dyspnea: Secondary | ICD-10-CM

## 2013-05-22 MED ORDER — PANTOPRAZOLE SODIUM 40 MG PO TBEC: DELAYED_RELEASE_TABLET | ORAL | Status: DC

## 2013-05-22 MED ORDER — FAMOTIDINE 20 MG PO TABS: ORAL_TABLET | ORAL | Status: DC

## 2013-05-22 NOTE — Progress Notes (Signed)
   Subjective:    Patient ID: [name], male    DOB: 11-Jun-1960  MRN: [id number]  HPI  16 yowm quit smoking 2008 with some noisy breathing got better p quit and with a breathe strip then noted onset of doe summer 2014 referred 05/22/2013 to pulmonary clinic by Dr Rene Paci.    05/22/2013 1st [facility] office visit/ Axzel Rockhill cc doe x walking uphill from shop to house June 2014 not really progressed assoc with ant cp R of midline hurts with deep breath no better at all with protonix taken bedtime. Treadmill done before pain,  Inhaler saba helps breathing, congestion sensation feels better p mucinex but never produces much mucus.  All this started after developing paroxyms of severe neck pain radiating down both arms with tingling rx with high dose advil rx by [place] as cervical problem per pt.    Every time walk get sob, cp assoc once a week with walking, but even then only with deep breath does he note it.  Cp also brought on lying on R side.  No obvious day to day or daytime variabilty or assoc   chest tightness, subjective wheeze overt sinus or hb symptoms. No unusual exp hx or h/o childhood pna/ asthma or knowledge of premature birth.  Sleeping ok without nocturnal  or early am exacerbation  of respiratory  c/o's or need for noct saba. Also denies any obvious fluctuation of symptoms with weather or environmental changes or other aggravating or alleviating factors except as outlined above   Current Medications, Allergies, Complete Past Medical History, Past Surgical History, Family History, and Social History were reviewed in [employer] record.          Review of Systems  Constitutional: Negative for fever and unexpected weight change.  HENT: Negative for congestion, dental problem, ear pain, nosebleeds, postnasal drip, rhinorrhea, sinus pressure, sneezing, sore throat and trouble swallowing.   Eyes: Negative for redness and itching.  Respiratory:  Positive for chest tightness and shortness of breath. Negative for cough and wheezing.   Cardiovascular: Negative for palpitations and leg swelling.  Gastrointestinal: Negative for nausea and vomiting.  Genitourinary: Negative for dysuria.  Musculoskeletal: Negative for joint swelling.  Skin: Negative for rash.  Neurological: Negative for headaches.  Hematological: Does not bruise/bleed easily.  Psychiatric/Behavioral: Negative for dysphoric mood. The patient is not nervous/anxious.        Objective:   Physical Exam  Wt Readings from Last 3 Encounters:  05/22/13 199 lb (90.266 kg)  08/11/11 190 lb (86.183 kg)  08/11/11 190 lb (86.183 kg)     HEENT mild turbinate edema.  Oropharynx no thrush or excess pnd or cobblestoning.  No JVD or cervical adenopathy. Mild accessory muscle hypertrophy. Trachea midline, nl thryroid. Chest was hyperinflated by percussion with diminished breath sounds and moderate increased exp time without wheeze. Hoover sign positive at mid inspiration. Regular rate and rhythm without murmur gallop or rub or increase P2 or edema.  Abd: no hsm, nl excursion. Ext warm without cyanosis or clubbing.      CXR 04/18/13 No active lung disease. Hyper aeration.     Assessment & Plan:

## 2013-05-22 NOTE — Patient Instructions (Addendum)
Pantoprazole (protonix) 40 mg   Take 30-60 min before first meal of the day and Pepcid(famotidine) 20 mg one bedtime until return to office - this is the best way to tell whether stomach acid is contributing to your problem.    GERD (REFLUX)  is an extremely common cause of respiratory symptoms, many times with no significant heartburn at all.    It can be treated with medication, but also with lifestyle changes including avoidance of late meals, excessive alcohol, smoking cessation, and avoid fatty foods, chocolate, peppermint, colas, red wine, and acidic juices such as orange juice.  NO MINT OR MENTHOL PRODUCTS SO NO COUGH DROPS  USE SUGARLESS CANDY INSTEAD (jolley ranchers or Stover's)  NO OIL BASED VITAMINS - use powdered substitutes.  For breathing > ventolin 2 puffs every 4h if needed    Only use motrin with meals and only if needed and supplement with the vicodin  Please schedule a follow up office visit in 4 weeks, sooner if needed with pfts

## 2013-05-23 DIAGNOSIS — R0600 Dyspnea, unspecified: Secondary | ICD-10-CM | POA: Insufficient documentation

## 2013-05-23 DIAGNOSIS — R0789 Other chest pain: Secondary | ICD-10-CM | POA: Insufficient documentation

## 2013-05-23 NOTE — Assessment & Plan Note (Addendum)
Pain with deep breath or R side down in the midline is more likely MSCP than gerd  But certainly no a classic pleuritic pattern near the midline.  rec he take advil just with meals and use vicodin for breakthrough pain.  If needs another CT neck would include CT Chest to be complete but not needed now

## 2013-05-23 NOTE — Assessment & Plan Note (Signed)
  When respiratory symptoms begin or become refractory well after a patient reports complete smoking cessation,  Especially when this wasn't the case while they were smoking, a red flag is raised based on the work of Dr Primitivo Gauze which states:  if you quit smoking when your best day FEV1 is still well preserved it is highly unlikely you will progress to severe disease.  That is to say, once the smoking stops,  the symptoms should not suddenly erupt or markedly worsen.  If so, the differential diagnosis should include  obesity/deconditioning,  LPR/Reflux/Aspiration syndromes,  occult CHF, or  especially side effect of medications commonly used in this population.    ? Acid (or non-acid) GERD > always difficult to exclude as up to 75% of pts in some series report no assoc GI/ Heartburn symptoms> rec max (24h)  acid suppression and diet restrictions/ reviewed and instructions given in writing.

## 2013-06-29 ENCOUNTER — Encounter: Payer: Self-pay | Admitting: Internal Medicine

## 2013-06-29 ENCOUNTER — Ambulatory Visit (INDEPENDENT_AMBULATORY_CARE_PROVIDER_SITE_OTHER): Payer: 59 | Admitting: Internal Medicine

## 2013-06-29 ENCOUNTER — Ambulatory Visit (INDEPENDENT_AMBULATORY_CARE_PROVIDER_SITE_OTHER): Payer: Commercial Managed Care - HMO | Admitting: Internal Medicine

## 2013-06-29 VITALS — BP 130/98 | HR 108 | Temp 97.8°F | Ht 71.0 in | Wt 198.0 lb

## 2013-06-29 DIAGNOSIS — R0609 Other forms of dyspnea: Secondary | ICD-10-CM

## 2013-06-29 DIAGNOSIS — R0989 Other specified symptoms and signs involving the circulatory and respiratory systems: Secondary | ICD-10-CM

## 2013-06-29 DIAGNOSIS — R0600 Dyspnea, unspecified: Secondary | ICD-10-CM

## 2013-06-29 NOTE — Assessment & Plan Note (Signed)
-   hfa 75% p coaching 05/22/13 > saba not effective - PFT's 06/29/2013  wnl   Symptoms are markedly disproportionate to objective findings and not clear this is a lung problem but pt does appear to have difficult airway management issues. DDX of  difficult airways managment all start with A and  include Adherence, Ace Inhibitors, Acid Reflux, Active Sinus Disease, Alpha 1 Antitripsin deficiency, Anxiety masquerading as Airways dz,  ABPA,  allergy(esp in young), Aspiration (esp in elderly), Adverse effects of DPI,  Active smokers, plus two Bs  = Bronchiectasis and Beta blocker use..and one C= CHF  Acid and non acid reflux at top of the list given a nl w/u to date > referred back to Dr Paulita Fujita  Anxiety > dx of exclusion but note he's already been placed on anxiolytics > should continue   Allergies/ asthma > no evidence of this and certainly does not have copd > pulmonary f/u is prn

## 2013-06-29 NOTE — Progress Notes (Signed)
PFT done today. 

## 2013-06-29 NOTE — Patient Instructions (Addendum)
Try to keep from clearing your throat by using sugarless candy like jolly ranchers    See Dr Paulita Fujita asap to discuss your reflux - I will send him copy of this evaluation  You do not have copd or asthma and very  unlikely you ever will unless you resume smoking - pulmonary follow up can be as needed

## 2013-06-29 NOTE — Progress Notes (Signed)
Subjective:    Patient ID: [name], male    DOB: September 17, 1960  MRN: [id number]    Brief patient profile:  5 yowm quit smoking 2008 with some noisy breathing got better p quit and with a breathe strip then noted onset of doe summer 2014 referred 05/22/2013 to pulmonary clinic by Dr Lorenda Hatchet.     History of Present Illness  05/22/2013 1st [facility] office visit/ Keshona Kartes cc doe x walking uphill from shop to house June 2014 not really progressed assoc with ant cp R of midline hurts with deep breath no better at all with protonix taken bedtime. Treadmill done before pain,  Inhaler saba helps breathing, congestion sensation feels better p mucinex but never produces much mucus.  All this started after developing paroxyms of severe neck pain radiating down both arms with tingling rx with high dose advil rx by [place] as cervical problem per pt.  Every time walk get sob, cp assoc once a week with walking, but even then only with deep breath does he note it.  Cp also brought on lying on R side. Pantoprazole (protonix) 40 mg   Take 30-60 min before first meal of the day and Pepcid(famotidine) 20 mg one bedtime until return to office - GERD diet  For breathing > ventolin 2 puffs every 4h if needed  Only use motrin with meals and only if needed and supplement with the vicodin      06/29/2013 f/u ov/Tamyra Fojtik re: cough  Chief Complaint  Patient presents with  . Follow-up    Pt states that his breathing is much improved. Still has chest discomort, but this is also improved some.   bad overt gerd when lying in certain positions  > has seen Dr Paulita Fujita Chest discomfort is positional, not exertional or pleuritic        No obvious day to day or daytime variabilty or assoc chronic cough or cp or chest tightness, subjective wheeze overt sinus   symptoms. No unusual exp hx or h/o childhood pna/ asthma or knowledge of premature birth.  Sleeping ok without nocturnal  or early am exacerbation  of  respiratory  c/o's or need for noct saba. Also denies any obvious fluctuation of symptoms with weather or environmental changes or other aggravating or alleviating factors except as outlined above   Current Medications, Allergies, Complete Past Medical History, Past Surgical History, Family History, and Social History were reviewed in [employer] record.  ROS  The following are not active complaints unless bolded sore throat, dysphagia, dental problems, itching, sneezing,  nasal congestion or excess/ purulent secretions, ear ache,   fever, chills, sweats, unintended wt loss, pleuritic or exertional cp, hemoptysis,  orthopnea pnd or leg swelling, presyncope, palpitations, heartburn, abdominal pain, anorexia, nausea, vomiting, diarrhea  or change in bowel or urinary habits, change in stools or urine, dysuria,hematuria,  rash, arthralgias, visual complaints, headache, numbness weakness or ataxia or problems with walking or coordination,  change in mood/affect or memory.                    Objective:   Physical Exam   06/29/2013   198  Wt Readings from Last 3 Encounters:  05/22/13 199 lb (90.266 kg)  08/11/11 190 lb (86.183 kg)  08/11/11 190 lb (86.183 kg)     HEENT: nl dentition, turbinates, and orophanx. Nl external ear canals without cough reflex   NECK :  without JVD/Nodes/TM/ nl carotid upstrokes bilaterally   LUNGS:  no acc muscle use, clear to A and P bilaterally without cough on insp or exp maneuvers   CV:  RRR  no s3 or murmur or increase in P2, no edema   ABD:  soft and nontender with nl excursion in the supine position. No bruits or organomegaly, bowel sounds nl  MS:  warm without deformities, calf tenderness, cyanosis or clubbing  SKIN: warm and dry without lesions    NEURO:  alert, approp, no deficits        CXR 04/18/13 No active lung disease. Hyper aeration.     Assessment & Plan:

## 2013-08-09 LAB — PULMONARY FUNCTION TEST
DL/VA % pred: 77 %
DL/VA: 3.66 ml/min/mmHg/L
DLCO UNC % PRED: 71 %
DLCO unc: 24.19 ml/min/mmHg
FEF 25-75 POST: 3.11 L/s
FEF 25-75 PRE: 3.01 L/s
FEF2575-%Change-Post: 3 %
FEF2575-%PRED-POST: 91 %
FEF2575-%PRED-PRE: 88 %
FEV1-%Change-Post: 0 %
FEV1-%PRED-POST: 94 %
FEV1-%Pred-Pre: 94 %
FEV1-PRE: 3.72 L
FEV1-Post: 3.74 L
FEV1FVC-%Change-Post: -2 %
FEV1FVC-%PRED-PRE: 99 %
FEV6-%CHANGE-POST: 3 %
FEV6-%PRED-POST: 99 %
FEV6-%Pred-Pre: 96 %
FEV6-POST: 4.93 L
FEV6-Pre: 4.79 L
FEV6FVC-%CHANGE-POST: 0 %
FEV6FVC-%Pred-Post: 102 %
FEV6FVC-%Pred-Pre: 102 %
FVC-%CHANGE-POST: 2 %
FVC-%PRED-PRE: 94 %
FVC-%Pred-Post: 97 %
FVC-POST: 5.01 L
FVC-Pre: 4.87 L
PRE FEV1/FVC RATIO: 76 %
Post FEV1/FVC ratio: 75 %
Post FEV6/FVC ratio: 98 %
Pre FEV6/FVC Ratio: 98 %
RV % pred: 75 %
RV: 1.63 L
TLC % pred: 90 %
TLC: 6.51 L

## 2014-07-09 ENCOUNTER — Ambulatory Visit: Payer: Commercial Managed Care - HMO | Admitting: Cardiology

## 2015-06-03 MED FILL — busPIRone HCL 10 MG TABS: 10 | 30 days supply | Qty: 90 | Fill #3

## 2015-06-04 MED FILL — ALPRAZolam 1 MG TABS: 1 | 30 days supply | Qty: 45 | Fill #1

## 2015-06-04 MED FILL — CYCLOBENZAPRINE 10 MG TAB: 10 | 30 days supply | Qty: 90 | Fill #0

## 2015-06-05 MED FILL — HYDROCODON-APAP 5-325: 5-325 | 15 days supply | Qty: 60 | Fill #0

## 2015-06-11 MED FILL — PANTOPRAZOLE SOD DR 40 MG T: 40 | 90 days supply | Qty: 180 | Fill #0

## 2015-07-03 MED FILL — HYDROCODON-APAP 5-325: 5-325 | 15 days supply | Qty: 60 | Fill #0

## 2015-07-08 MED FILL — ALPRAZolam 1 MG TABS: 1 | 30 days supply | Qty: 45 | Fill #2

## 2015-07-08 MED FILL — CYCLOBENZAPRINE 10 MG TAB: 10 | 30 days supply | Qty: 90 | Fill #1

## 2015-07-08 MED FILL — busPIRone HCL 10 MG TABS: 10 | 30 days supply | Qty: 90 | Fill #4

## 2015-07-12 DIAGNOSIS — L989 Disorder of the skin and subcutaneous tissue, unspecified: Secondary | ICD-10-CM | POA: Diagnosis not present

## 2015-07-12 DIAGNOSIS — K219 Gastro-esophageal reflux disease without esophagitis: Secondary | ICD-10-CM | POA: Diagnosis not present

## 2015-07-12 DIAGNOSIS — E782 Mixed hyperlipidemia: Secondary | ICD-10-CM | POA: Diagnosis not present

## 2015-07-12 DIAGNOSIS — G629 Polyneuropathy, unspecified: Secondary | ICD-10-CM | POA: Diagnosis not present

## 2015-07-12 DIAGNOSIS — Z1159 Encounter for screening for other viral diseases: Secondary | ICD-10-CM | POA: Diagnosis not present

## 2015-07-12 DIAGNOSIS — M5090 Cervical disc disorder, unspecified, unspecified cervical region: Secondary | ICD-10-CM | POA: Diagnosis not present

## 2015-07-12 DIAGNOSIS — F419 Anxiety disorder, unspecified: Secondary | ICD-10-CM | POA: Diagnosis not present

## 2015-08-05 MED FILL — CYCLOBENZAPRINE 10 MG TAB: 10 | 30 days supply | Qty: 90 | Fill #2

## 2015-08-05 MED FILL — busPIRone HCL 10 MG TABS: 10 | 30 days supply | Qty: 90 | Fill #5

## 2015-08-05 MED FILL — ALPRAZolam 1 MG TABS: 1 | 30 days supply | Qty: 45 | Fill #3

## 2015-08-05 MED FILL — HYDROCODON-APAP 5-325: 5-325 | 15 days supply | Qty: 60 | Fill #0

## 2015-08-07 MED FILL — traMADol HCL 50 MG TABS: 50 | 22 days supply | Qty: 45 | Fill #0

## 2015-08-30 DIAGNOSIS — D485 Neoplasm of uncertain behavior of skin: Secondary | ICD-10-CM | POA: Diagnosis not present

## 2015-08-30 DIAGNOSIS — L300 Nummular dermatitis: Secondary | ICD-10-CM | POA: Diagnosis not present

## 2015-08-30 DIAGNOSIS — L308 Other specified dermatitis: Secondary | ICD-10-CM | POA: Diagnosis not present

## 2015-08-30 DIAGNOSIS — Z85828 Personal history of other malignant neoplasm of skin: Secondary | ICD-10-CM | POA: Diagnosis not present

## 2015-08-30 DIAGNOSIS — L821 Other seborrheic keratosis: Secondary | ICD-10-CM | POA: Diagnosis not present

## 2015-09-02 MED FILL — HYDROCODON-APAP 5-325: 5-325 | 15 days supply | Qty: 60 | Fill #0

## 2015-09-02 MED FILL — CLOBETASOL 0.05% OINTMENT: 0.05 | 15 days supply | Qty: 60 | Fill #0

## 2015-09-03 DIAGNOSIS — J3501 Chronic tonsillitis: Secondary | ICD-10-CM | POA: Diagnosis not present

## 2015-09-03 DIAGNOSIS — K219 Gastro-esophageal reflux disease without esophagitis: Secondary | ICD-10-CM | POA: Diagnosis not present

## 2015-09-03 MED FILL — TACROLIMUS 0.1% OINTMENT: 0.1 | 30 days supply | Qty: 60 | Fill #0

## 2015-09-04 MED FILL — CYCLOBENZAPRINE 10 MG TAB: 10 | 30 days supply | Qty: 90 | Fill #0

## 2015-09-04 MED FILL — PANTOPRAZOLE SOD DR 40 MG T: 40 | 90 days supply | Qty: 180 | Fill #1

## 2015-09-04 MED FILL — traMADol HCL 50 MG TABS: 50 | 22 days supply | Qty: 45 | Fill #1

## 2015-09-04 MED FILL — busPIRone HCL 10 MG TABS: 10 | 30 days supply | Qty: 90 | Fill #0

## 2015-09-04 MED FILL — ALPRAZolam 1 MG TABS: 1 | 30 days supply | Qty: 45 | Fill #4

## 2015-09-30 MED FILL — traMADol HCL 50 MG TABS: 50 | 23 days supply | Qty: 45 | Fill #0

## 2015-09-30 MED FILL — CYCLOBENZAPRINE 10 MG TAB: 10 | 30 days supply | Qty: 90 | Fill #1

## 2015-10-03 MED FILL — ALPRAZolam 1 MG TABS: 1 | 30 days supply | Qty: 45 | Fill #0

## 2015-10-03 MED FILL — HYDROCODON-APAP 5-325: 5-325 | 15 days supply | Qty: 60 | Fill #0

## 2015-10-08 DIAGNOSIS — M47816 Spondylosis without myelopathy or radiculopathy, lumbar region: Secondary | ICD-10-CM | POA: Diagnosis not present

## 2015-10-08 DIAGNOSIS — M546 Pain in thoracic spine: Secondary | ICD-10-CM | POA: Diagnosis not present

## 2015-10-08 DIAGNOSIS — R51 Headache: Secondary | ICD-10-CM | POA: Diagnosis not present

## 2015-10-08 DIAGNOSIS — M5416 Radiculopathy, lumbar region: Secondary | ICD-10-CM | POA: Diagnosis not present

## 2015-10-08 DIAGNOSIS — M5136 Other intervertebral disc degeneration, lumbar region: Secondary | ICD-10-CM | POA: Diagnosis not present

## 2015-10-08 DIAGNOSIS — M5030 Other cervical disc degeneration, unspecified cervical region: Secondary | ICD-10-CM | POA: Diagnosis not present

## 2015-10-08 DIAGNOSIS — M545 Low back pain: Secondary | ICD-10-CM | POA: Diagnosis not present

## 2015-10-08 DIAGNOSIS — M4722 Other spondylosis with radiculopathy, cervical region: Secondary | ICD-10-CM | POA: Diagnosis not present

## 2015-10-30 DIAGNOSIS — D2262 Melanocytic nevi of left upper limb, including shoulder: Secondary | ICD-10-CM | POA: Diagnosis not present

## 2015-10-30 DIAGNOSIS — D1801 Hemangioma of skin and subcutaneous tissue: Secondary | ICD-10-CM | POA: Diagnosis not present

## 2015-10-30 DIAGNOSIS — D2261 Melanocytic nevi of right upper limb, including shoulder: Secondary | ICD-10-CM | POA: Diagnosis not present

## 2015-10-30 DIAGNOSIS — D225 Melanocytic nevi of trunk: Secondary | ICD-10-CM | POA: Diagnosis not present

## 2015-10-30 DIAGNOSIS — D2372 Other benign neoplasm of skin of left lower limb, including hip: Secondary | ICD-10-CM | POA: Diagnosis not present

## 2015-10-30 DIAGNOSIS — D2272 Melanocytic nevi of left lower limb, including hip: Secondary | ICD-10-CM | POA: Diagnosis not present

## 2015-10-30 DIAGNOSIS — D2271 Melanocytic nevi of right lower limb, including hip: Secondary | ICD-10-CM | POA: Diagnosis not present

## 2015-10-30 DIAGNOSIS — Z85828 Personal history of other malignant neoplasm of skin: Secondary | ICD-10-CM | POA: Diagnosis not present

## 2015-10-30 DIAGNOSIS — D2371 Other benign neoplasm of skin of right lower limb, including hip: Secondary | ICD-10-CM | POA: Diagnosis not present

## 2015-11-05 MED FILL — ALPRAZolam 1 MG TABS: 1 | 30 days supply | Qty: 45 | Fill #1

## 2015-11-05 MED FILL — CYCLOBENZAPRINE 10 MG TAB: 10 | 30 days supply | Qty: 90 | Fill #2

## 2015-11-05 MED FILL — busPIRone HCL 10 MG TABS: 10 | 30 days supply | Qty: 90 | Fill #1

## 2015-11-06 MED FILL — MOMETASONE FUROATE 0.1% CRM: 0.1 | 30 days supply | Qty: 45 | Fill #0

## 2015-11-11 MED FILL — HYDROCODON-APAP 5-325: 5-325 | 15 days supply | Qty: 60 | Fill #0

## 2015-12-02 MED FILL — PANTOPRAZOLE SOD DR 40 MG T: 40 | 90 days supply | Qty: 180 | Fill #2

## 2015-12-02 MED FILL — busPIRone HCL 10 MG TABS: 10 | 30 days supply | Qty: 90 | Fill #2

## 2015-12-02 MED FILL — traMADol HCL 50 MG TABS: 50 | 23 days supply | Qty: 45 | Fill #1

## 2015-12-03 MED FILL — ALPRAZolam 1 MG TABS: 1 | 30 days supply | Qty: 45 | Fill #2

## 2015-12-04 MED FILL — HYDROCODON-APAP 5-325: 5-325 | 15 days supply | Qty: 60 | Fill #0

## 2015-12-04 MED FILL — CYCLOBENZAPRINE 10 MG TAB: 10 | 30 days supply | Qty: 90 | Fill #0

## 2016-01-03 MED FILL — busPIRone HCL 10 MG TABS: 10 | 30 days supply | Qty: 90 | Fill #3

## 2016-01-03 MED FILL — CYCLOBENZAPRINE 10 MG TAB: 10 | 30 days supply | Qty: 90 | Fill #1

## 2016-01-03 MED FILL — ALPRAZolam 1 MG TABS: 1 | 30 days supply | Qty: 45 | Fill #3

## 2016-01-06 MED FILL — HYDROCODON-APAP 5-325: 5-325 | 15 days supply | Qty: 60 | Fill #0

## 2016-01-06 MED FILL — traMADol HCL 50 MG TABS: 50 | 30 days supply | Qty: 45 | Fill #0

## 2016-01-10 ENCOUNTER — Other Ambulatory Visit: Payer: Self-pay | Admitting: Internal Medicine

## 2016-01-10 DIAGNOSIS — R079 Chest pain, unspecified: Secondary | ICD-10-CM

## 2016-01-10 DIAGNOSIS — F419 Anxiety disorder, unspecified: Secondary | ICD-10-CM | POA: Diagnosis not present

## 2016-01-10 DIAGNOSIS — G8929 Other chronic pain: Secondary | ICD-10-CM | POA: Diagnosis not present

## 2016-01-10 DIAGNOSIS — E782 Mixed hyperlipidemia: Secondary | ICD-10-CM | POA: Diagnosis not present

## 2016-01-10 DIAGNOSIS — G629 Polyneuropathy, unspecified: Secondary | ICD-10-CM | POA: Diagnosis not present

## 2016-01-10 DIAGNOSIS — T148 Other injury of unspecified body region: Secondary | ICD-10-CM | POA: Diagnosis not present

## 2016-01-10 DIAGNOSIS — Z0000 Encounter for general adult medical examination without abnormal findings: Secondary | ICD-10-CM | POA: Diagnosis not present

## 2016-01-10 DIAGNOSIS — Z1389 Encounter for screening for other disorder: Secondary | ICD-10-CM | POA: Diagnosis not present

## 2016-01-10 DIAGNOSIS — K219 Gastro-esophageal reflux disease without esophagitis: Secondary | ICD-10-CM | POA: Diagnosis not present

## 2016-01-10 DIAGNOSIS — M5090 Cervical disc disorder, unspecified, unspecified cervical region: Secondary | ICD-10-CM | POA: Diagnosis not present

## 2016-01-21 ENCOUNTER — Telehealth (HOSPITAL_COMMUNITY): Payer: Self-pay | Admitting: *Deleted

## 2016-01-21 NOTE — Telephone Encounter (Signed)
Patient given detailed instructions per Myocardial Perfusion Study Information Sheet for the test on 01/24/16 at 1100. Patient notified to arrive 15 minutes early and that it is imperative to arrive on time for appointment to keep from having the test rescheduled.  If you need to cancel or reschedule your appointment, please call the office within 24 hours of your appointment. Failure to do so may result in a cancellation of your appointment, and a $50 no show fee. Patient verbalized understanding.Tiago Humphrey, Ranae Palms

## 2016-01-23 ENCOUNTER — Telehealth: Payer: Self-pay

## 2016-01-23 NOTE — Telephone Encounter (Signed)
Received a call from patient's wife.She stated husband is scheduled for a lexiscan tomorrow, he lost his instructions.Instructions reviewed with wife she voiced understanding.

## 2016-01-24 ENCOUNTER — Ambulatory Visit (HOSPITAL_COMMUNITY): Payer: 59 | Attending: Cardiology

## 2016-01-24 DIAGNOSIS — I10 Essential (primary) hypertension: Secondary | ICD-10-CM | POA: Insufficient documentation

## 2016-01-24 DIAGNOSIS — R079 Chest pain, unspecified: Secondary | ICD-10-CM | POA: Insufficient documentation

## 2016-01-24 DIAGNOSIS — R0609 Other forms of dyspnea: Secondary | ICD-10-CM | POA: Insufficient documentation

## 2016-01-24 MED ORDER — REGADENOSON 0.4 MG/5ML IV SOLN
0.4000 mg | Freq: Once | INTRAVENOUS | Status: AC
Start: 2016-01-24 — End: 2016-01-24
  Administered 2016-01-24: 0.4 mg via INTRAVENOUS

## 2016-01-24 MED ORDER — TECHNETIUM TC 99M TETROFOSMIN IV KIT
30.8000 | PACK | Freq: Once | INTRAVENOUS | Status: AC | PRN
  Administered 2016-01-24: 30.8 via INTRAVENOUS
  Filled 2016-01-24: qty 31

## 2016-01-24 MED ORDER — TECHNETIUM TC 99M TETROFOSMIN IV KIT
10.6000 | PACK | Freq: Once | INTRAVENOUS | Status: AC | PRN
  Administered 2016-01-24: 11 via INTRAVENOUS
  Filled 2016-01-24: qty 11

## 2016-01-26 LAB — MYOCARDIAL PERFUSION IMAGING
CHL CUP NUCLEAR SSS: 5
LVDIAVOL: 91 mL (ref 62–150)
LVSYSVOL: 38 mL
Peak HR: 88 {beats}/min
RATE: 0.19
Rest HR: 69 {beats}/min
SDS: 3
SRS: 2
TID: 0.98

## 2016-01-31 ENCOUNTER — Telehealth: Payer: Self-pay | Admitting: Cardiovascular Disease

## 2016-01-31 NOTE — Telephone Encounter (Signed)
New message        Calling to see if pt's stress test has been read and if the report was sent to his PCP----Dr Deforest Hoyles?

## 2016-01-31 NOTE — Telephone Encounter (Signed)
Attempt to return call-no answer.

## 2016-02-04 NOTE — Telephone Encounter (Signed)
Left msg to call. Looks like this test was reviewed by Dr. Deforest Hoyles

## 2016-02-05 MED FILL — busPIRone HCL 10 MG TABS: 10 | 30 days supply | Qty: 90 | Fill #4

## 2016-02-05 MED FILL — HYDROCODON-APAP 5-325: 5-325 | 15 days supply | Qty: 60 | Fill #0

## 2016-02-05 MED FILL — CYCLOBENZAPRINE 10 MG TAB: 10 | 30 days supply | Qty: 90 | Fill #2

## 2016-02-05 MED FILL — ALPRAZolam 1 MG TABS: 1 | 30 days supply | Qty: 45 | Fill #4

## 2016-03-09 MED FILL — ALPRAZolam 1 MG TABS: 1 | 30 days supply | Qty: 45 | Fill #0

## 2016-03-09 MED FILL — PANTOPRAZOLE SOD DR 40 MG T: 40 | 90 days supply | Qty: 180 | Fill #3

## 2016-03-09 MED FILL — HYDROCODON-APAP 5-325: 5-325 | 20 days supply | Qty: 60 | Fill #0

## 2016-03-09 MED FILL — busPIRone HCL 10 MG TABS: 10 | 30 days supply | Qty: 90 | Fill #5

## 2016-04-01 MED FILL — busPIRone HCL 10 MG TABS: 10 | 30 days supply | Qty: 90 | Fill #0

## 2016-04-03 DIAGNOSIS — S67192A Crushing injury of right middle finger, initial encounter: Secondary | ICD-10-CM | POA: Diagnosis not present

## 2016-04-03 DIAGNOSIS — S6710XA Crushing injury of unspecified finger(s), initial encounter: Secondary | ICD-10-CM | POA: Diagnosis not present

## 2016-04-03 MED FILL — METHYLPREDNISOLONE 4 MG TAB: 4 | 6 days supply | Qty: 21 | Fill #0

## 2016-04-06 MED FILL — ALPRAZolam 1 MG TABS: 1 | 30 days supply | Qty: 45 | Fill #1

## 2016-04-07 DIAGNOSIS — M5030 Other cervical disc degeneration, unspecified cervical region: Secondary | ICD-10-CM | POA: Diagnosis not present

## 2016-04-07 DIAGNOSIS — Z6827 Body mass index (BMI) 27.0-27.9, adult: Secondary | ICD-10-CM | POA: Diagnosis not present

## 2016-04-07 DIAGNOSIS — R030 Elevated blood-pressure reading, without diagnosis of hypertension: Secondary | ICD-10-CM | POA: Diagnosis not present

## 2016-04-07 DIAGNOSIS — M4722 Other spondylosis with radiculopathy, cervical region: Secondary | ICD-10-CM | POA: Diagnosis not present

## 2016-04-07 MED FILL — HYDROCODON-APAP 5-325: 5-325 | 15 days supply | Qty: 60 | Fill #0

## 2016-04-13 MED FILL — CYCLOBENZAPRINE 10 MG TAB: 10 | 30 days supply | Qty: 90 | Fill #0

## 2016-05-11 MED FILL — busPIRone HCL 10 MG TABS: 10 | 30 days supply | Qty: 90 | Fill #1

## 2016-05-11 MED FILL — HYDROCODON-APAP 5-325: 5-325 | 15 days supply | Qty: 60 | Fill #0

## 2016-05-11 MED FILL — ALPRAZolam 1 MG TABS: 1 | 30 days supply | Qty: 45 | Fill #2

## 2016-05-11 MED FILL — CYCLOBENZAPRINE 10 MG TAB: 10 | 30 days supply | Qty: 90 | Fill #1

## 2016-05-11 MED FILL — PANTOPRAZOLE SOD DR 40 MG T: 40 | 90 days supply | Qty: 180 | Fill #0

## 2016-05-11 MED FILL — traMADol HCL 50 MG TABS: 50 | 30 days supply | Qty: 45 | Fill #1

## 2016-05-22 ENCOUNTER — Encounter (HOSPITAL_COMMUNITY): Payer: Self-pay | Admitting: Emergency Medicine

## 2016-05-22 ENCOUNTER — Emergency Department (HOSPITAL_COMMUNITY)
Admission: EM | Admit: 2016-05-22 | Discharge: 2016-05-22 | Disposition: A | Discharge status: Home / Self Care | Payer: 59 | Attending: Emergency Medicine | Admitting: Emergency Medicine

## 2016-05-22 DIAGNOSIS — Z87891 Personal history of nicotine dependence: Secondary | ICD-10-CM | POA: Insufficient documentation

## 2016-05-22 DIAGNOSIS — I10 Essential (primary) hypertension: Secondary | ICD-10-CM | POA: Diagnosis not present

## 2016-05-22 MED ORDER — AMLODIPINE BESYLATE 5 MG PO TABS
5.0000 mg | ORAL_TABLET | Freq: Once | ORAL | Status: AC
  Administered 2016-05-22: 5 mg via ORAL
  Filled 2016-05-22: qty 1

## 2016-05-22 MED ORDER — AMLODIPINE BESYLATE 5 MG PO TABS: 5.0000 mg | ORAL_TABLET | Freq: Every day | ORAL | 0 refills | Status: DC

## 2016-05-22 NOTE — ED Notes (Signed)
Dr Wentz in to assess 

## 2016-05-22 NOTE — ED Notes (Signed)
Pt reports stress test in Sept that was clean by [facility], pulmonary stress test that was good.   His wife is supposed to be checking her BP and checked his and he has had HTN readings for the last 13 days

## 2016-05-22 NOTE — ED Provider Notes (Signed)
[facility] DEPT Provider Note   CSN: [id number] Arrival date & time: 05/22/16  2028  By signing my name below, I, Reola Mosher, attest that this documentation has been prepared under the direction and in the presence of Daleen Bo, MD. Electronically Signed: Reola Mosher, ED Scribe. 05/22/16. 9:28 PM.  History   Chief Complaint Chief Complaint  Patient presents with  . Hypertension   The history is provided by the patient. No language interpreter was used.    HPI Comments: [name] is a 55 y.o. male with a PMHx of anxiety, who presents to the Emergency Department complaining of high blood pressure. Pt reports that he does not have a PMHx of HTN, however, he has been recently keeping track of his blood pressure at home over the past 13 days which he states has been high. Pt reports that his last tracked BP tonight was recorded as 191/128, prompting him to come into the ED. He additionally states that he has been having intermittent apex headaches, which will occasionally radiate into the frontal region, which have been occurring over the past week as well. He notes associated blurry vision secondary to the onset of his headache. Pt is a non-smoker. Pt reports that he had a recent cardiac stress test performed 1-2 months ago, and pulmonary test which occurred ~1 year ago, both of which were non-abnormal. He denies chest pain, shortness of breath, or any other associated symptoms.   Past Medical History:  Diagnosis Date  . Anxiety   . Arthritis    neck and hands  . DJD (degenerative joint disease), cervical   . Dyslipidemia   . Fatty liver   . GERD (gastroesophageal reflux disease)   . H/O hiatal hernia   . MRSA (methicillin resistant Staphylococcus aureus)    boil on abdomine in 2006  . Peripheral neuropathy ([place])   . Sleep apnea    breath right strips   Patient Active Problem List   Diagnosis Date Noted  . Dyspnea 05/23/2013  . Chest pain,  musculoskeletal 05/23/2013   Past Surgical History:  Procedure Laterality Date  . BACK SURGERY     neck surgery X2  . GANGLION CYST EXCISION     right hand  . left elbow surgery torn muscle    . right ear surgery      Home Medications    Prior to Admission medications   Medication Sig Start Date End Date Taking? Authorizing Provider  albuterol (PROVENTIL HFA;VENTOLIN HFA) 108 (90 BASE) MCG/ACT inhaler Inhale 2 puffs into the lungs every 6 (six) hours as needed for wheezing or shortness of breath.    Historical Provider, MD  ALPRAZolam Duanne Moron) 1 MG tablet Take 1 mg by mouth as needed.    Historical Provider, MD  amLODipine (NORVASC) 5 MG tablet Take 1 tablet (5 mg total) by mouth daily. 05/22/16   Daleen Bo, MD  busPIRone (BUSPAR) 10 MG tablet Take 10 mg by mouth 3 (three) times daily.    Historical Provider, MD  cyclobenzaprine (FLEXERIL) 10 MG tablet Take 10 mg by mouth 3 (three) times daily as needed.    Historical Provider, MD  famotidine (PEPCID) 20 MG tablet One at bedtime 05/22/13   Tanda Rockers, MD  HYDROcodone-acetaminophen (VICODIN) 5-500 MG per tablet Take 1 tablet by mouth every 6 (six) hours as needed.    Historical Provider, MD  ibuprofen (ADVIL,MOTRIN) 200 MG tablet Take 200 mg by mouth 2 (two) times daily as needed.  Historical Provider, MD  pantoprazole (PROTONIX) 40 MG tablet Take 30-60 min before first meal of the day 05/22/13   Tanda Rockers, MD   Family History Family History  Problem Relation Age of Onset  . Tuberculosis Father   . Heart failure Mother   . COPD Mother    Social History Social History  Substance Use Topics  . Smoking status: Former Smoker    Packs/day: 3.00    Years: 30.00    Types: Cigarettes    Quit date: 08/11/2006  . Smokeless tobacco: Never Used  . Alcohol use 2.4 oz/week    4 Cans of beer per week   Allergies   Voltaren [diclofenac sodium]; Crestor [rosuvastatin]; Lyrica [pregabalin]; and Simvastatin  Review of  Systems Review of Systems  All other systems reviewed and are negative.  Physical Exam Updated Vital Signs BP (!) 172/122 (BP Location: Left Arm)   Pulse 67   Temp 97.9 F (36.6 C)   Resp 16   Ht 5\' 11"  (1.803 m)   Wt 195 lb (88.5 kg)   SpO2 96%   BMI 27.20 kg/m   Physical Exam  Constitutional: He is oriented to person, place, and time. He appears well-developed and well-nourished.  HENT:  Head: Normocephalic and atraumatic.  Right Ear: External ear normal.  Left Ear: External ear normal.  Eyes: Conjunctivae and EOM are normal. Pupils are equal, round, and reactive to light.  Neck: Normal range of motion and phonation normal. Neck supple.  Cardiovascular: Normal rate, regular rhythm and normal heart sounds.   Pulmonary/Chest: Effort normal and breath sounds normal. He exhibits no bony tenderness.  Abdominal: Soft. There is no tenderness.  Musculoskeletal: Normal range of motion.  Neurological: He is alert and oriented to person, place, and time. No cranial nerve deficit or sensory deficit. He exhibits normal muscle tone. Coordination normal.  No dysarthria, aphasia, or nystagmus. No pronator drift.   Skin: Skin is warm, dry and intact.  Psychiatric: He has a normal mood and affect. His behavior is normal. Judgment and thought content normal.  Nursing note and vitals reviewed.  ED Treatments / Results  DIAGNOSTIC STUDIES: Oxygen Saturation is 98% on RA, normal by my interpretation.   COORDINATION OF CARE: 9:25 PM-Discussed next steps with pt. Pt verbalized understanding and is agreeable with the plan.   Labs (all labs ordered are listed, but only abnormal results are displayed) Labs Reviewed - No data to display  EKG  EKG Interpretation  Date/Time:  Friday May 22 2016 20:54:38 EST Ventricular Rate:  69 PR Interval:    QRS Duration: 94 QT Interval:  402 QTC Calculation: 431 R Axis:   64 Text Interpretation:  Sinus rhythm Abnormal R-wave progression, early  transition since last tracing no significant change Confirmed by Eulis Foster  MD, Vira Agar [id number]) on 05/22/2016 9:14:36 PM      Radiology No results found.  Procedures Procedures   Medications Ordered in ED Medications  amLODipine (NORVASC) tablet 5 mg (5 mg Oral Given 05/22/16 2140)    Initial Impression / Assessment and Plan / ED Course  I have reviewed the triage vital signs and the nursing notes.  Pertinent labs & imaging results that were available during my care of the patient were reviewed by me and considered in my medical decision making (see chart for details).  Clinical Course    Medications  amLODipine (NORVASC) tablet 5 mg (5 mg Oral Given 05/22/16 2140)    Patient Vitals for the past 24 hrs:  BP Temp Pulse Resp SpO2 Height Weight  05/22/16 2152 (!) 172/122 - 67 16 96 % - -  05/22/16 2140 (!) 164/120 - - - - - -  05/22/16 2130 (!) 164/120 - - 19 - - -  05/22/16 2100 (!) 160/117 - 66 16 95 % - -  05/22/16 2034 - - - - - 5\' 11"  (1.803 m) 195 lb (88.5 kg)  05/22/16 2033 (!) 196/122 97.9 F (36.6 C) 73 18 98 % - -    At D/C Reevaluation with update and discussion. After initial assessment and treatment, an updated evaluation reveals He is comfortable. Findings discussed with the patient and all questions were answered. Thaxton Pelley L   Final Clinical Impressions(s) / ED Diagnoses   Final diagnoses:  Hypertension, unspecified type   Hypertension, subacute onset and worsening. When patient has cardiac stress tests September 2017. His blood pressure was averaging 160/105. He's had screening blood work in [employer] by his physician, biannually. Doubt hypertensive crisis, or ACS.  Nursing Notes Reviewed/ Care Coordinated Applicable Imaging Reviewed Interpretation of Laboratory Data incorporated into ED treatment  The patient appears reasonably screened and/or stabilized for discharge and I doubt any other medical condition or other [facility] requiring further  screening, evaluation, or treatment in the ED at this time prior to discharge.  Plan: Home Medications- continue; Home Treatments- decrease salt intake; return here if the recommended treatment, does not improve the symptoms; Recommended follow up- PCP, one or 2 weeks for follow-up care and management of blood pressure   New Prescriptions Discharge Medication List as of 05/22/2016  9:37 PM    START taking these medications   Details  amLODipine (NORVASC) 5 MG tablet Take 1 tablet (5 mg total) by mouth daily., Starting Fri 05/22/2016, Print       I personally performed the services described in this documentation, which was scribed in my presence. The recorded information has been reviewed and is accurate.    Daleen Bo, MD 05/22/16 [phone]

## 2016-05-22 NOTE — ED Triage Notes (Signed)
Pt states he has been watching blood pressure for a few weeks due to headaches. Pt never diagnosed with hypertension, but pressures have been high. Pt also c/o indigestion.

## 2016-05-22 NOTE — ED Notes (Signed)
Education: BP meds - sit up slowly, no sudden movements until used to meds.

## 2016-05-22 NOTE — Discharge Instructions (Signed)
Avoid all added salt to your diet.  Try to eat a low sodium diet.

## 2016-06-04 DIAGNOSIS — R1011 Right upper quadrant pain: Secondary | ICD-10-CM | POA: Diagnosis not present

## 2016-06-04 DIAGNOSIS — I10 Essential (primary) hypertension: Secondary | ICD-10-CM | POA: Diagnosis not present

## 2016-06-04 MED FILL — AMLODIPINE BESYLATE 10 MG T: 10 | 90 days supply | Qty: 90 | Fill #0

## 2016-06-09 MED FILL — ALPRAZolam 1 MG TABS: 1 | 30 days supply | Qty: 45 | Fill #3

## 2016-06-09 MED FILL — CYCLOBENZAPRINE 10 MG TAB: 10 | 30 days supply | Qty: 90 | Fill #2

## 2016-06-09 MED FILL — HYDROCODON-APAP 5-325: 5-325 | 15 days supply | Qty: 60 | Fill #0

## 2016-06-09 MED FILL — busPIRone HCL 10 MG TABS: 10 | 30 days supply | Qty: 90 | Fill #2

## 2016-06-23 DIAGNOSIS — I10 Essential (primary) hypertension: Secondary | ICD-10-CM | POA: Diagnosis not present

## 2016-06-23 MED FILL — AMLODIPINE-BENAZEPRIL 10-20: 10-20 | 30 days supply | Qty: 30 | Fill #0

## 2016-07-09 MED FILL — busPIRone HCL 10 MG TABS: 10 | 30 days supply | Qty: 90 | Fill #3

## 2016-07-09 MED FILL — ALPRAZolam 1 MG TABS: 1 | 30 days supply | Qty: 45 | Fill #4

## 2016-07-13 MED FILL — HYDROCODON-APAP 5-325: 5-325 | 15 days supply | Qty: 60 | Fill #0

## 2016-07-14 MED FILL — CYCLOBENZAPRINE 10 MG TAB: 10 | 30 days supply | Qty: 90 | Fill #0

## 2016-07-17 DIAGNOSIS — G629 Polyneuropathy, unspecified: Secondary | ICD-10-CM | POA: Diagnosis not present

## 2016-07-17 DIAGNOSIS — I10 Essential (primary) hypertension: Secondary | ICD-10-CM | POA: Diagnosis not present

## 2016-07-17 DIAGNOSIS — F419 Anxiety disorder, unspecified: Secondary | ICD-10-CM | POA: Diagnosis not present

## 2016-07-17 DIAGNOSIS — E782 Mixed hyperlipidemia: Secondary | ICD-10-CM | POA: Diagnosis not present

## 2016-07-17 DIAGNOSIS — G8929 Other chronic pain: Secondary | ICD-10-CM | POA: Diagnosis not present

## 2016-07-24 MED FILL — AMLODIPINE-BENAZEPRIL 10-20: 10-20 | 30 days supply | Qty: 30 | Fill #1

## 2016-08-05 MED FILL — busPIRone HCL 10 MG TABS: 10 | 30 days supply | Qty: 90 | Fill #4

## 2016-08-05 MED FILL — PANTOPRAZOLE SOD DR 40 MG T: 40 | 90 days supply | Qty: 180 | Fill #1

## 2016-08-06 MED FILL — ALPRAZolam 1 MG TABS: 1 | 30 days supply | Qty: 45 | Fill #0

## 2016-08-12 MED FILL — HYDROCODON-APAP 5-325: 5-325 | 15 days supply | Qty: 60 | Fill #0

## 2016-08-27 MED FILL — AMLODIPINE-BENAZEPRIL 10-20: 10-20 | 30 days supply | Qty: 30 | Fill #2

## 2016-09-07 MED FILL — busPIRone HCL 10 MG TABS: 10 | 30 days supply | Qty: 90 | Fill #0

## 2016-09-07 MED FILL — CYCLOBENZAPRINE 10 MG TAB: 10 | 30 days supply | Qty: 90 | Fill #1

## 2016-09-07 MED FILL — ALPRAZolam 1 MG TABS: 1 | 30 days supply | Qty: 45 | Fill #1

## 2016-09-08 MED FILL — HYDROCODON-APAP 5-325: 5-325 | 15 days supply | Qty: 60 | Fill #0

## 2016-09-22 MED FILL — AMLODIPINE-BENAZEPRIL 10-20: 10-20 | 30 days supply | Qty: 30 | Fill #3

## 2016-10-09 DIAGNOSIS — M542 Cervicalgia: Secondary | ICD-10-CM | POA: Diagnosis not present

## 2016-10-09 DIAGNOSIS — Z6827 Body mass index (BMI) 27.0-27.9, adult: Secondary | ICD-10-CM | POA: Diagnosis not present

## 2016-10-09 DIAGNOSIS — R51 Headache: Secondary | ICD-10-CM | POA: Diagnosis not present

## 2016-10-09 MED FILL — ALPRAZolam 1 MG TABS: 1 | 30 days supply | Qty: 45 | Fill #2

## 2016-10-09 MED FILL — busPIRone HCL 10 MG TABS: 10 | 30 days supply | Qty: 90 | Fill #1

## 2016-10-09 MED FILL — CYCLOBENZAPRINE 10 MG TAB: 10 | 30 days supply | Qty: 90 | Fill #0

## 2016-10-09 MED FILL — HYDROCODON-APAP 5-325: 5-325 | 15 days supply | Qty: 60 | Fill #0

## 2016-10-12 MED FILL — MOMETASONE FUROATE 0.1% CRM: 0.1 | 30 days supply | Qty: 45 | Fill #1

## 2016-10-23 ENCOUNTER — Other Ambulatory Visit: Payer: Self-pay | Admitting: Internal Medicine

## 2016-10-23 ENCOUNTER — Ambulatory Visit
Admission: RE | Admit: 2016-10-23 | Discharge: 2016-10-23 | Disposition: A | Discharge status: Home / Self Care | Payer: 59 | Source: Ambulatory Visit | Attending: Internal Medicine | Admitting: Internal Medicine

## 2016-10-23 DIAGNOSIS — K219 Gastro-esophageal reflux disease without esophagitis: Secondary | ICD-10-CM | POA: Diagnosis not present

## 2016-10-23 DIAGNOSIS — R079 Chest pain, unspecified: Secondary | ICD-10-CM

## 2016-10-23 DIAGNOSIS — R0602 Shortness of breath: Secondary | ICD-10-CM | POA: Diagnosis not present

## 2016-10-23 DIAGNOSIS — F411 Generalized anxiety disorder: Secondary | ICD-10-CM | POA: Diagnosis not present

## 2016-10-23 DIAGNOSIS — R05 Cough: Secondary | ICD-10-CM | POA: Diagnosis not present

## 2016-10-23 MED FILL — busPIRone HCL 15 MG TABS: 15 | 30 days supply | Qty: 90 | Fill #0

## 2016-10-29 DIAGNOSIS — L821 Other seborrheic keratosis: Secondary | ICD-10-CM | POA: Diagnosis not present

## 2016-10-29 DIAGNOSIS — D2372 Other benign neoplasm of skin of left lower limb, including hip: Secondary | ICD-10-CM | POA: Diagnosis not present

## 2016-10-29 DIAGNOSIS — D2271 Melanocytic nevi of right lower limb, including hip: Secondary | ICD-10-CM | POA: Diagnosis not present

## 2016-10-29 DIAGNOSIS — D2261 Melanocytic nevi of right upper limb, including shoulder: Secondary | ICD-10-CM | POA: Diagnosis not present

## 2016-10-29 DIAGNOSIS — D2262 Melanocytic nevi of left upper limb, including shoulder: Secondary | ICD-10-CM | POA: Diagnosis not present

## 2016-10-29 DIAGNOSIS — Z85828 Personal history of other malignant neoplasm of skin: Secondary | ICD-10-CM | POA: Diagnosis not present

## 2016-10-29 DIAGNOSIS — L814 Other melanin hyperpigmentation: Secondary | ICD-10-CM | POA: Diagnosis not present

## 2016-10-29 DIAGNOSIS — D2272 Melanocytic nevi of left lower limb, including hip: Secondary | ICD-10-CM | POA: Diagnosis not present

## 2016-10-29 DIAGNOSIS — D225 Melanocytic nevi of trunk: Secondary | ICD-10-CM | POA: Diagnosis not present

## 2016-11-03 MED FILL — PANTOPRAZOLE SOD DR 40 MG T: 40 | 90 days supply | Qty: 180 | Fill #2

## 2016-11-03 MED FILL — AMLODIPINE-BENAZEPRIL 10-20: 10-20 | 30 days supply | Qty: 30 | Fill #4

## 2016-11-03 MED FILL — CYCLOBENZAPRINE 10 MG TAB: 10 | 30 days supply | Qty: 90 | Fill #1

## 2016-11-06 MED FILL — ALPRAZolam 1 MG TABS: 1 | 30 days supply | Qty: 45 | Fill #3

## 2016-11-09 MED FILL — HYDROCODON-APAP 5-325: 5-325 | 15 days supply | Qty: 60 | Fill #0

## 2016-12-03 MED FILL — HYDROCODON-APAP 5-325: 5-325 | 15 days supply | Qty: 60 | Fill #0

## 2016-12-03 MED FILL — AMLODIPINE-BENAZEPRIL 10-20: 10-20 | 30 days supply | Qty: 30 | Fill #5

## 2016-12-03 MED FILL — busPIRone HCL 15 MG TABS: 15 | 30 days supply | Qty: 90 | Fill #1

## 2016-12-04 MED FILL — CYCLOBENZAPRINE 10 MG TAB: 10 | 30 days supply | Qty: 90 | Fill #0

## 2016-12-04 MED FILL — ALPRAZolam 1 MG TABS: 1 | 30 days supply | Qty: 45 | Fill #4

## 2017-01-01 MED FILL — ALPRAZolam 1 MG TABS: 1 | 30 days supply | Qty: 45 | Fill #0

## 2017-01-01 MED FILL — busPIRone HCL 15 MG TABS: 15 | 30 days supply | Qty: 90 | Fill #2

## 2017-01-01 MED FILL — CYCLOBENZAPRINE 10 MG TAB: 10 | 30 days supply | Qty: 90 | Fill #1

## 2017-01-01 MED FILL — AMLODIPINE-BENAZEPRIL 10-20: 10-20 | 30 days supply | Qty: 30 | Fill #6

## 2017-01-04 MED FILL — HYDROCODON-APAP 5-325: 5-325 | 15 days supply | Qty: 60 | Fill #0

## 2017-01-12 MED FILL — CLOBETASOL 0.05% OINTMENT: 0.05 | 30 days supply | Qty: 60 | Fill #0

## 2017-01-22 ENCOUNTER — Ambulatory Visit
Admission: RE | Admit: 2017-01-22 | Discharge: 2017-01-22 | Disposition: A | Discharge status: Home / Self Care | Payer: 59 | Source: Ambulatory Visit | Attending: Internal Medicine | Admitting: Internal Medicine

## 2017-01-22 ENCOUNTER — Other Ambulatory Visit: Payer: Self-pay | Admitting: Internal Medicine

## 2017-01-22 DIAGNOSIS — M542 Cervicalgia: Secondary | ICD-10-CM

## 2017-01-22 DIAGNOSIS — G629 Polyneuropathy, unspecified: Secondary | ICD-10-CM | POA: Diagnosis not present

## 2017-01-22 DIAGNOSIS — M25512 Pain in left shoulder: Secondary | ICD-10-CM | POA: Diagnosis not present

## 2017-01-22 DIAGNOSIS — G8929 Other chronic pain: Secondary | ICD-10-CM | POA: Diagnosis not present

## 2017-01-22 DIAGNOSIS — M5090 Cervical disc disorder, unspecified, unspecified cervical region: Secondary | ICD-10-CM | POA: Diagnosis not present

## 2017-01-22 DIAGNOSIS — Z1389 Encounter for screening for other disorder: Secondary | ICD-10-CM | POA: Diagnosis not present

## 2017-01-22 DIAGNOSIS — M4322 Fusion of spine, cervical region: Secondary | ICD-10-CM | POA: Diagnosis not present

## 2017-01-22 DIAGNOSIS — Z0000 Encounter for general adult medical examination without abnormal findings: Secondary | ICD-10-CM | POA: Diagnosis not present

## 2017-01-22 DIAGNOSIS — Z125 Encounter for screening for malignant neoplasm of prostate: Secondary | ICD-10-CM | POA: Diagnosis not present

## 2017-01-22 DIAGNOSIS — M1990 Unspecified osteoarthritis, unspecified site: Secondary | ICD-10-CM | POA: Diagnosis not present

## 2017-01-22 DIAGNOSIS — E782 Mixed hyperlipidemia: Secondary | ICD-10-CM | POA: Diagnosis not present

## 2017-01-22 DIAGNOSIS — F419 Anxiety disorder, unspecified: Secondary | ICD-10-CM | POA: Diagnosis not present

## 2017-01-22 DIAGNOSIS — I10 Essential (primary) hypertension: Secondary | ICD-10-CM | POA: Diagnosis not present

## 2017-02-08 MED FILL — busPIRone HCL 15 MG TABS: 15 | 30 days supply | Qty: 90 | Fill #3

## 2017-02-08 MED FILL — HYDROCODON-APAP 5-325: 5-325 | 15 days supply | Qty: 60 | Fill #0

## 2017-02-08 MED FILL — ALPRAZolam 1 MG TABS: 1 | 30 days supply | Qty: 45 | Fill #1

## 2017-02-08 MED FILL — CYCLOBENZAPRINE 10 MG TAB: 10 | 30 days supply | Qty: 90 | Fill #0

## 2017-02-08 MED FILL — AMLODIPINE-BENAZEPRIL 10-20: 10-20 | 30 days supply | Qty: 30 | Fill #7

## 2017-02-08 MED FILL — PANTOPRAZOLE SOD DR 40 MG T: 40 | 90 days supply | Qty: 180 | Fill #3

## 2017-02-09 DIAGNOSIS — H2513 Age-related nuclear cataract, bilateral: Secondary | ICD-10-CM | POA: Diagnosis not present

## 2017-03-11 MED FILL — busPIRone HCL 15 MG TABS: 15 | 30 days supply | Qty: 90 | Fill #4

## 2017-03-11 MED FILL — CYCLOBENZAPRINE 10 MG TAB: 10 | 30 days supply | Qty: 90 | Fill #1

## 2017-03-11 MED FILL — HYDROCODON-APAP 5-325: 5-325 | 15 days supply | Qty: 60 | Fill #0

## 2017-03-11 MED FILL — AMLODIPINE-BENAZEPRIL 10-20: 10-20 | 30 days supply | Qty: 30 | Fill #8

## 2017-03-11 MED FILL — ALPRAZolam 1 MG TABS: 1 | 30 days supply | Qty: 45 | Fill #2

## 2017-04-12 MED FILL — CYCLOBENZAPRINE 10 MG TAB: 10 | 30 days supply | Qty: 90 | Fill #0

## 2017-04-12 MED FILL — AMLODIPINE-BENAZEPRIL 10-20: 10-20 | 30 days supply | Qty: 30 | Fill #9

## 2017-04-12 MED FILL — ALPRAZolam 1 MG TABS: 1 | 30 days supply | Qty: 45 | Fill #3

## 2017-04-12 MED FILL — busPIRone HCL 15 MG TABS: 15 | 30 days supply | Qty: 90 | Fill #5

## 2017-04-14 DIAGNOSIS — M542 Cervicalgia: Secondary | ICD-10-CM | POA: Diagnosis not present

## 2017-04-14 DIAGNOSIS — Z6826 Body mass index (BMI) 26.0-26.9, adult: Secondary | ICD-10-CM | POA: Diagnosis not present

## 2017-04-14 DIAGNOSIS — M4722 Other spondylosis with radiculopathy, cervical region: Secondary | ICD-10-CM | POA: Diagnosis not present

## 2017-04-14 DIAGNOSIS — M5030 Other cervical disc degeneration, unspecified cervical region: Secondary | ICD-10-CM | POA: Diagnosis not present

## 2017-04-14 MED FILL — HYDROCODON-APAP 5-325: 5-325 | 15 days supply | Qty: 60 | Fill #0

## 2017-04-19 DIAGNOSIS — H903 Sensorineural hearing loss, bilateral: Secondary | ICD-10-CM | POA: Diagnosis not present

## 2017-05-06 MED FILL — ALPRAZolam 1 MG TABS: 1 | 30 days supply | Qty: 45 | Fill #4

## 2017-05-06 MED FILL — CYCLOBENZAPRINE 10 MG TAB: 10 | 30 days supply | Qty: 90 | Fill #1

## 2017-05-06 MED FILL — AMLODIPINE-BENAZEPRIL 10-20: 10-20 | 30 days supply | Qty: 30 | Fill #10

## 2017-05-06 MED FILL — busPIRone HCL 30 MG TABS: 30 | 90 days supply | Qty: 135 | Fill #0

## 2017-05-06 MED FILL — PANTOPRAZOLE SOD DR 40 MG T: 40 | 90 days supply | Qty: 180 | Fill #0

## 2017-05-17 MED FILL — HYDROCODON-APAP 5-325: 5-325 | 15 days supply | Qty: 60 | Fill #0

## 2017-06-11 MED FILL — AMLODIPINE-BENAZEPRIL 10-20: 10-20 | 30 days supply | Qty: 30 | Fill #11

## 2017-06-11 MED FILL — CYCLOBENZAPRINE 10 MG TAB: 10 | 30 days supply | Qty: 90 | Fill #0

## 2017-06-14 MED FILL — ALPRAZolam 1 MG TABS: 1 | 30 days supply | Qty: 45 | Fill #0

## 2017-06-21 MED FILL — HYDROCODON-APAP 5-325: 5-325 | 15 days supply | Qty: 60 | Fill #0

## 2017-06-24 DIAGNOSIS — M67912 Unspecified disorder of synovium and tendon, left shoulder: Secondary | ICD-10-CM | POA: Diagnosis not present

## 2017-07-19 MED FILL — busPIRone HCL 30 MG TABS: 30 | 90 days supply | Qty: 135 | Fill #1

## 2017-07-19 MED FILL — ALPRAZolam 1 MG TABS: 1 | 30 days supply | Qty: 45 | Fill #1

## 2017-07-19 MED FILL — CYCLOBENZAPRINE 10 MG TAB: 10 | 30 days supply | Qty: 90 | Fill #1

## 2017-07-19 MED FILL — AMLODIPINE-BENAZEPRIL 10-20: 10-20 | 30 days supply | Qty: 30 | Fill #0

## 2017-07-20 MED FILL — HYDROCODON-APAP 5-325: 5-325 | 15 days supply | Qty: 60 | Fill #0

## 2017-07-30 DIAGNOSIS — G8929 Other chronic pain: Secondary | ICD-10-CM | POA: Diagnosis not present

## 2017-07-30 DIAGNOSIS — N182 Chronic kidney disease, stage 2 (mild): Secondary | ICD-10-CM | POA: Diagnosis not present

## 2017-07-30 DIAGNOSIS — F419 Anxiety disorder, unspecified: Secondary | ICD-10-CM | POA: Diagnosis not present

## 2017-07-30 DIAGNOSIS — I10 Essential (primary) hypertension: Secondary | ICD-10-CM | POA: Diagnosis not present

## 2017-07-30 DIAGNOSIS — E782 Mixed hyperlipidemia: Secondary | ICD-10-CM | POA: Diagnosis not present

## 2017-07-30 DIAGNOSIS — K219 Gastro-esophageal reflux disease without esophagitis: Secondary | ICD-10-CM | POA: Diagnosis not present

## 2017-07-30 DIAGNOSIS — Z1389 Encounter for screening for other disorder: Secondary | ICD-10-CM | POA: Diagnosis not present

## 2017-08-23 MED FILL — HYDROCODON-APAP 5-325: 5-325 | 15 days supply | Qty: 60 | Fill #0

## 2017-08-23 MED FILL — PANTOPRAZOLE SOD DR 40 MG T: 40 | 90 days supply | Qty: 180 | Fill #1

## 2017-08-23 MED FILL — AMLODIPINE-BENAZEPRIL 10-20: 10-20 | 30 days supply | Qty: 30 | Fill #1

## 2017-08-23 MED FILL — ALPRAZolam 1 MG TABS: 1 | 30 days supply | Qty: 45 | Fill #2

## 2017-08-25 MED FILL — CYCLOBENZAPRINE 10 MG TAB: 10 | 30 days supply | Qty: 90 | Fill #0

## 2017-09-17 IMAGING — NM NM MISC PROCEDURE
6 series · 36 of 36 positions shown · non-contrast
Comparison: none

[Series 1: wbr_r-proj_st rest · 6.51mm/px · 6 of 64 frames shown]
[frame 6/64]
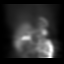
[frame 16/64]
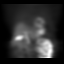
[frame 27/64]
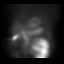
[frame 38/64]
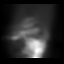
[frame 48/64]
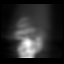
[frame 59/64]
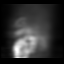

[Series 1: rest · 6.51mm/px · 6 of 64 frames shown]
[frame 6/64]
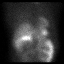
[frame 16/64]
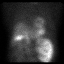
[frame 27/64]
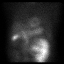
[frame 38/64]
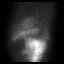
[frame 48/64]
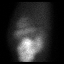
[frame 59/64]
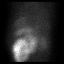

[Series 2: stress · 6.51mm/px · 6 of 512 frames shown (1 of 2)]
[frame 43/512]
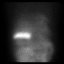
[frame 128/512]
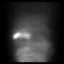
[frame 214/512]
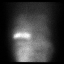
[frame 299/512]
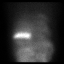
[frame 384/512]
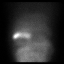
[frame 470/512]
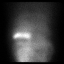

[Series 2: wbr_s-proj_st stress · 6.51mm/px · 6 of 512 frames shown (1 of 2)]
[frame 43/512]
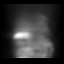
[frame 128/512]
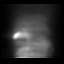
[frame 214/512]
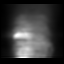
[frame 299/512]
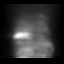
[frame 384/512]
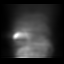
[frame 470/512]
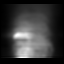

[Series 2: wbr_s-proj_st stress · 6.51mm/px · 6 of 64 frames shown (2 of 2)]
[frame 6/64]
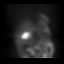
[frame 16/64]
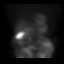
[frame 27/64]
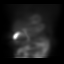
[frame 38/64]
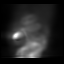
[frame 48/64]
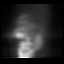
[frame 59/64]
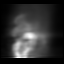

[Series 2: stress · 6.51mm/px · 6 of 64 frames shown (2 of 2)]
[frame 6/64]
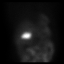
[frame 16/64]
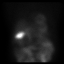
[frame 27/64]
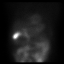
[frame 38/64]
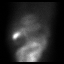
[frame 48/64]
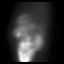
[frame 59/64]
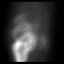

[36 of 36 positions shown; findings below may reference images not displayed]

Canned report from images found in remote index.

Refer to host system for actual result text.

## 2017-09-22 MED FILL — ALPRAZolam 1 MG TABS: 1 | 30 days supply | Qty: 45 | Fill #3

## 2017-09-22 MED FILL — AMLODIPINE-BENAZEPRIL 10-20: 10-20 | 30 days supply | Qty: 30 | Fill #2

## 2017-09-23 MED FILL — HYDROCODON-APAP 5-325: 5-325 | 15 days supply | Qty: 60 | Fill #0

## 2017-09-24 MED FILL — CYCLOBENZAPRINE 10 MG TAB: 10 | 30 days supply | Qty: 90 | Fill #1

## 2017-10-20 DIAGNOSIS — M4722 Other spondylosis with radiculopathy, cervical region: Secondary | ICD-10-CM | POA: Diagnosis not present

## 2017-10-20 DIAGNOSIS — M5030 Other cervical disc degeneration, unspecified cervical region: Secondary | ICD-10-CM | POA: Diagnosis not present

## 2017-10-20 DIAGNOSIS — Z6828 Body mass index (BMI) 28.0-28.9, adult: Secondary | ICD-10-CM | POA: Diagnosis not present

## 2017-10-20 DIAGNOSIS — Z981 Arthrodesis status: Secondary | ICD-10-CM | POA: Diagnosis not present

## 2017-10-20 DIAGNOSIS — M542 Cervicalgia: Secondary | ICD-10-CM | POA: Diagnosis not present

## 2017-10-21 MED FILL — busPIRone HCL 30 MG TABS: 30 | 90 days supply | Qty: 135 | Fill #2

## 2017-10-21 MED FILL — AMLODIPINE-BENAZEPRIL 10-20: 10-20 | 30 days supply | Qty: 30 | Fill #3

## 2017-10-21 MED FILL — ALPRAZolam 1 MG TABS: 1 | 30 days supply | Qty: 45 | Fill #4

## 2017-10-22 MED FILL — CYCLOBENZAPRINE 10 MG TAB: 10 | 30 days supply | Qty: 90 | Fill #0

## 2017-10-26 MED FILL — HYDROCODON-APAP 5-325: 5-325 | 15 days supply | Qty: 60 | Fill #0

## 2017-11-16 DIAGNOSIS — R351 Nocturia: Secondary | ICD-10-CM | POA: Diagnosis not present

## 2017-11-16 DIAGNOSIS — N400 Enlarged prostate without lower urinary tract symptoms: Secondary | ICD-10-CM | POA: Diagnosis not present

## 2017-11-16 DIAGNOSIS — R350 Frequency of micturition: Secondary | ICD-10-CM | POA: Diagnosis not present

## 2017-11-16 MED FILL — TAMSULOSIN HCL 0.4 MG CAP: 0.4 | 30 days supply | Qty: 30 | Fill #0

## 2017-11-19 MED FILL — CYCLOBENZAPRINE 10 MG TAB: 10 | 30 days supply | Qty: 90 | Fill #0

## 2017-11-19 MED FILL — PANTOPRAZOLE SOD DR 40 MG T: 40 | 90 days supply | Qty: 180 | Fill #2

## 2017-11-19 MED FILL — ALPRAZolam 1 MG TABS: 1 | 30 days supply | Qty: 45 | Fill #0

## 2017-11-19 MED FILL — AMLODIPINE-BENAZEPRIL 10-20: 10-20 | 30 days supply | Qty: 30 | Fill #4

## 2017-11-24 MED FILL — HYDROCODON-APAP 5-325: 5-325 | 15 days supply | Qty: 60 | Fill #0

## 2017-12-08 DIAGNOSIS — R350 Frequency of micturition: Secondary | ICD-10-CM | POA: Diagnosis not present

## 2017-12-08 DIAGNOSIS — R109 Unspecified abdominal pain: Secondary | ICD-10-CM | POA: Diagnosis not present

## 2017-12-08 DIAGNOSIS — R3914 Feeling of incomplete bladder emptying: Secondary | ICD-10-CM | POA: Diagnosis not present

## 2017-12-08 DIAGNOSIS — R351 Nocturia: Secondary | ICD-10-CM | POA: Diagnosis not present

## 2017-12-08 DIAGNOSIS — N201 Calculus of ureter: Secondary | ICD-10-CM | POA: Diagnosis not present

## 2017-12-08 DIAGNOSIS — R3912 Poor urinary stream: Secondary | ICD-10-CM | POA: Diagnosis not present

## 2017-12-13 MED FILL — TAMSULOSIN HCL 0.4 MG CAP: 0.4 | 30 days supply | Qty: 30 | Fill #1

## 2017-12-19 ENCOUNTER — Telehealth: Payer: 59 | Admitting: Family

## 2017-12-19 DIAGNOSIS — H60331 Swimmer's ear, right ear: Secondary | ICD-10-CM

## 2017-12-19 MED ORDER — CIPROFLOXACIN-HYDROCORTISONE 0.2-1 % OT SUSP: 3.0000 [drp] | Freq: Two times a day (BID) | OTIC | 0 refills | Status: DC

## 2017-12-19 NOTE — Progress Notes (Signed)
E Visit for Swimmer's Ear  We are sorry that you are not feeling well. Here is how we plan to help!  Based on what you have shared with me it looks like you have swimmers ear. Swimmer's ear is a redness or swelling, irritation, or infection of your outer ear canal.  These symptoms usually occur within a few days of swimming.  Your ear canal is a tube that goes from the opening of the ear to the eardrum.  When water stays in your ear canal, germs can grow.  This is a painful condition that often happens to children and swimmers of all ages.  It is not contagious and oral antibiotics are not required to treat uncomplicated swimmer's ear.  The usual symptoms include: Itching inside the ear, Redness or a sense of swelling in the ear, Pain when the ear is tugged on when pressure is placed on the ear, Pus draining from the infected ear. and I have prescribed: Ciprofloxin 0.2% and hydrocortisone 1% otic suspension 3 drops in affected ears twice daily until completed    In certain cases swimmer's ear may progress to a more serious bacterial infection of the middle or inner ear.  If you have a fever 102 and up and significantly worsening symptoms, this could indicate a more serious infection moving to the middle/inner and needs face to face evaluation in an office by a provider.  Your symptoms should improve over the next 3 days and should resolve in about 7 days.  HOME CARE:   Wash your hands frequently.  Do not place the tip of the bottle on your ear or touch it with your fingers.  You can take Acetominophen 650 mg every 4-6 hours as needed for pain.  If pain is severe or moderate, you can apply a heating pad (set on low) or hot water bottle (wrapped in a towel) to outer ear for 20 minutes.  This will also increase drainage.  Avoid ear plugs  Do not use Q-tips  After showers, help the water run out by tilting your head to one side.  GET HELP RIGHT AWAY IF:   Fever is over 102.2 degrees.  You  develop progressive ear pain or hearing loss.  Ear symptoms persist longer than 3 days after treatment.  MAKE SURE YOU:   Understand these instructions.  Will watch your condition.  Will get help right away if you are not doing well or get worse.  TO PREVENT SWIMMER'S EAR:  Use a bathing cap or custom fitted swim molds to keep your ears dry.  Towel off after swimming to dry your ears.  Tilt your head or pull your earlobes to allow the water to escape your ear canal.  If there is still water in your ears, consider using a hairdryer on the lowest setting.  Thank you for choosing an e-visit. Your e-visit answers were reviewed by a board certified advanced clinical practitioner to complete your personal care plan. Depending upon the condition, your plan could have included both over the counter or prescription medications. Please review your pharmacy choice. Be sure that the pharmacy you have chosen is open so that you can pick up your prescription now.  If there is a problem you may message your provider in MyChart to have the prescription routed to another pharmacy. Your safety is important to us. If you have drug allergies check your prescription carefully.  For the next 24 hours, you can use MyChart to ask questions about today's   visit, request a non-urgent call back, or ask for a work or school excuse from your e-visit provider. You will get an email in the next two days asking about your experience. I hope that your e-visit has been valuable and will speed your recovery.       

## 2017-12-20 MED ORDER — NEOMYCIN-POLYMYXIN-HC 3.5-10000-1 OT SOLN: 4.0000 [drp] | Freq: Four times a day (QID) | OTIC | 0 refills | Status: DC

## 2017-12-20 MED FILL — NEO/POLYMYXIN/HC EAR SOLN: 3.5-10000-1 | 10 days supply | Qty: 10 | Fill #0

## 2017-12-20 NOTE — Addendum Note (Signed)
Addended by: Evelina Dun A on: 12/20/2017 05:44 PM   Modules accepted: Orders

## 2017-12-22 MED FILL — ALPRAZolam 1 MG TABS: 1 | 30 days supply | Qty: 45 | Fill #1

## 2017-12-22 MED FILL — AMLODIPINE-BENAZEPRIL 10-20: 10-20 | 30 days supply | Qty: 30 | Fill #5

## 2017-12-22 MED FILL — CYCLOBENZAPRINE 10 MG TAB: 10 | 30 days supply | Qty: 90 | Fill #0

## 2017-12-28 MED FILL — HYDROCODON-APAP 5-325: 5-325 | 15 days supply | Qty: 60 | Fill #0

## 2017-12-31 DIAGNOSIS — H9193 Unspecified hearing loss, bilateral: Secondary | ICD-10-CM | POA: Diagnosis not present

## 2017-12-31 DIAGNOSIS — Z87891 Personal history of nicotine dependence: Secondary | ICD-10-CM | POA: Diagnosis not present

## 2017-12-31 DIAGNOSIS — Z7289 Other problems related to lifestyle: Secondary | ICD-10-CM | POA: Diagnosis not present

## 2017-12-31 DIAGNOSIS — H6121 Impacted cerumen, right ear: Secondary | ICD-10-CM | POA: Diagnosis not present

## 2017-12-31 DIAGNOSIS — H6981 Other specified disorders of Eustachian tube, right ear: Secondary | ICD-10-CM | POA: Diagnosis not present

## 2017-12-31 DIAGNOSIS — Z974 Presence of external hearing-aid: Secondary | ICD-10-CM | POA: Diagnosis not present

## 2017-12-31 MED FILL — FLUTICASONE PROP 50 MCG SPR: 50 | 30 days supply | Qty: 16 | Fill #0

## 2018-01-06 DIAGNOSIS — R350 Frequency of micturition: Secondary | ICD-10-CM | POA: Diagnosis not present

## 2018-01-06 DIAGNOSIS — R3914 Feeling of incomplete bladder emptying: Secondary | ICD-10-CM | POA: Diagnosis not present

## 2018-01-06 MED FILL — TAMSULOSIN HCL 0.4 MG CAP: 0.4 | 30 days supply | Qty: 60 | Fill #0

## 2018-01-25 MED FILL — ALPRAZolam 1 MG TABS: 1 | 30 days supply | Qty: 45 | Fill #2

## 2018-01-25 MED FILL — CYCLOBENZAPRINE 10 MG TAB: 10 | 30 days supply | Qty: 90 | Fill #0

## 2018-01-25 MED FILL — AMLODIPINE-BENAZEPRIL 10-20: 10-20 | 30 days supply | Qty: 30 | Fill #0

## 2018-01-25 MED FILL — busPIRone HCL 30 MG TABS: 30 | 90 days supply | Qty: 135 | Fill #3

## 2018-01-27 MED FILL — HYDROCODON-APAP 5-325: 5-325 | 15 days supply | Qty: 60 | Fill #0

## 2018-02-01 MED FILL — PANTOPRAZOLE SOD DR 40 MG T: 40 | 90 days supply | Qty: 180 | Fill #0

## 2018-02-04 DIAGNOSIS — E782 Mixed hyperlipidemia: Secondary | ICD-10-CM | POA: Diagnosis not present

## 2018-02-04 DIAGNOSIS — F419 Anxiety disorder, unspecified: Secondary | ICD-10-CM | POA: Diagnosis not present

## 2018-02-04 DIAGNOSIS — Z0000 Encounter for general adult medical examination without abnormal findings: Secondary | ICD-10-CM | POA: Diagnosis not present

## 2018-02-04 DIAGNOSIS — M1990 Unspecified osteoarthritis, unspecified site: Secondary | ICD-10-CM | POA: Diagnosis not present

## 2018-02-04 DIAGNOSIS — G8929 Other chronic pain: Secondary | ICD-10-CM | POA: Diagnosis not present

## 2018-02-04 DIAGNOSIS — I10 Essential (primary) hypertension: Secondary | ICD-10-CM | POA: Diagnosis not present

## 2018-02-04 DIAGNOSIS — Z125 Encounter for screening for malignant neoplasm of prostate: Secondary | ICD-10-CM | POA: Diagnosis not present

## 2018-02-04 DIAGNOSIS — J309 Allergic rhinitis, unspecified: Secondary | ICD-10-CM | POA: Diagnosis not present

## 2018-02-04 DIAGNOSIS — Z1389 Encounter for screening for other disorder: Secondary | ICD-10-CM | POA: Diagnosis not present

## 2018-02-04 DIAGNOSIS — K219 Gastro-esophageal reflux disease without esophagitis: Secondary | ICD-10-CM | POA: Diagnosis not present

## 2018-02-21 MED FILL — TAMSULOSIN HCL 0.4 MG CAP: 0.4 | 30 days supply | Qty: 60 | Fill #1

## 2018-02-28 MED FILL — AMLODIPINE-BENAZEPRIL 10-20: 10-20 | 30 days supply | Qty: 30 | Fill #1

## 2018-02-28 MED FILL — CYCLOBENZAPRINE 10 MG TAB: 10 | 30 days supply | Qty: 90 | Fill #0

## 2018-02-28 MED FILL — ALPRAZolam 1 MG TABS: 1 | 30 days supply | Qty: 45 | Fill #3

## 2018-03-02 MED FILL — HYDROCODON-APAP 5-325: 5-325 | 15 days supply | Qty: 60 | Fill #0

## 2018-03-23 MED FILL — TAMSULOSIN HCL 0.4 MG CAP: 0.4 | 30 days supply | Qty: 60 | Fill #2

## 2018-03-24 MED FILL — AMLODIPINE-BENAZEPRIL 10-20: 10-20 | 30 days supply | Qty: 30 | Fill #2

## 2018-03-24 MED FILL — CYCLOBENZAPRINE 10 MG TAB: 10 | 30 days supply | Qty: 90 | Fill #0

## 2018-03-30 MED FILL — HYDROCODON-APAP 5-325: 5-325 | 15 days supply | Qty: 60 | Fill #0

## 2018-03-30 MED FILL — ALPRAZolam 1 MG TABS: 1 | 30 days supply | Qty: 45 | Fill #4

## 2018-04-11 MED FILL — busPIRone HCL 30 MG TABS: 30 | 90 days supply | Qty: 180 | Fill #0

## 2018-04-22 DIAGNOSIS — Z981 Arthrodesis status: Secondary | ICD-10-CM | POA: Diagnosis not present

## 2018-04-22 DIAGNOSIS — M542 Cervicalgia: Secondary | ICD-10-CM | POA: Diagnosis not present

## 2018-04-22 DIAGNOSIS — M4722 Other spondylosis with radiculopathy, cervical region: Secondary | ICD-10-CM | POA: Diagnosis not present

## 2018-04-22 DIAGNOSIS — M5030 Other cervical disc degeneration, unspecified cervical region: Secondary | ICD-10-CM | POA: Diagnosis not present

## 2018-04-22 MED FILL — CYCLOBENZAPRINE 10 MG TAB: 10 | 30 days supply | Qty: 90 | Fill #0

## 2018-04-26 MED FILL — TAMSULOSIN HCL 0.4 MG CAP: 0.4 | 30 days supply | Qty: 60 | Fill #3

## 2018-04-26 MED FILL — PANTOPRAZOLE SOD DR 40 MG T: 40 | 90 days supply | Qty: 180 | Fill #1

## 2018-04-26 MED FILL — AMLODIPINE-BENAZEPRIL 10-20: 10-20 | 30 days supply | Qty: 30 | Fill #3

## 2018-04-30 MED FILL — ALPRAZolam 1 MG TABS: 1 | 30 days supply | Qty: 45 | Fill #0

## 2018-05-02 MED FILL — HYDROCODON-APAP 5-325: 5-325 | 15 days supply | Qty: 60 | Fill #0

## 2018-05-30 MED FILL — ALPRAZolam 1 MG TABS: 1 | 30 days supply | Qty: 45 | Fill #1

## 2018-05-30 MED FILL — TAMSULOSIN HCL 0.4 MG CAP: 0.4 | 30 days supply | Qty: 60 | Fill #4

## 2018-05-30 MED FILL — CYCLOBENZAPRINE 10 MG TAB: 10 | 30 days supply | Qty: 90 | Fill #1

## 2018-05-30 MED FILL — AMLODIPINE-BENAZEPRIL 10-20: 10-20 | 30 days supply | Qty: 30 | Fill #4

## 2018-06-06 MED FILL — HYDROCODON-APAP 5-325: 5-325 | 15 days supply | Qty: 60 | Fill #0

## 2018-06-30 MED FILL — HYDROCODON-APAP 5-325: 5-325 | 15 days supply | Qty: 60 | Fill #0

## 2018-07-01 MED FILL — AMLODIPINE-BENAZEPRIL 10-20: 10-20 | 30 days supply | Qty: 30 | Fill #0

## 2018-07-01 MED FILL — TAMSULOSIN HCL 0.4 MG CAP: 0.4 | 30 days supply | Qty: 60 | Fill #5

## 2018-07-01 MED FILL — busPIRone HCL 30 MG TABS: 30 | 90 days supply | Qty: 180 | Fill #1

## 2018-07-01 MED FILL — ALPRAZolam 1 MG TABS: 1 | 30 days supply | Qty: 45 | Fill #2

## 2018-07-01 MED FILL — CYCLOBENZAPRINE 10 MG TAB: 10 | 30 days supply | Qty: 90 | Fill #2

## 2018-07-30 MED FILL — CYCLOBENZAPRINE 10 MG TAB: 10 | 30 days supply | Qty: 90 | Fill #3 | Status: TO

## 2018-07-30 MED FILL — TAMSULOSIN HCL 0.4 MG CAP: 0.4 | 30 days supply | Qty: 60 | Fill #6 | Status: TO

## 2018-07-30 MED FILL — PANTOPRAZOLE SOD DR 40 MG T: 40 | 90 days supply | Qty: 180 | Fill #2

## 2018-07-30 MED FILL — AMLODIPINE-BENAZEPRIL 10-20: 10-20 | 30 days supply | Qty: 30 | Fill #1 | Status: TO

## 2018-08-01 MED FILL — ALPRAZolam 1 MG TABS: 1 | 30 days supply | Qty: 45 | Fill #3 | Status: TO

## 2018-08-04 MED FILL — HYDROCODON-APAP 5-325: 5-325 | 15 days supply | Qty: 60 | Fill #0

## 2018-08-26 MED FILL — HYDROCODON-APAP 5-325: 5-325 | 15 days supply | Qty: 60 | Fill #0

## 2018-08-27 MED FILL — busPIRone HCL 30 MG TABS: 30 | 90 days supply | Qty: 180 | Fill #0

## 2018-08-27 MED FILL — TAMSULOSIN HCL 0.4 MG CAP: 0.4 | 30 days supply | Qty: 60 | Fill #0

## 2018-08-27 MED FILL — CYCLOBENZAPRINE HCL 10 MG T: 10 | 30 days supply | Qty: 90 | Fill #0

## 2018-08-29 MED FILL — AMLODIPINE-BENAZEPRIL 10-20: 10-20 | 30 days supply | Qty: 30 | Fill #0

## 2018-08-29 MED FILL — ALPRAZolam 1 MG TABS: 1 | 30 days supply | Qty: 45 | Fill #0

## 2018-09-15 DIAGNOSIS — E782 Mixed hyperlipidemia: Secondary | ICD-10-CM | POA: Diagnosis not present

## 2018-09-15 DIAGNOSIS — G8929 Other chronic pain: Secondary | ICD-10-CM | POA: Diagnosis not present

## 2018-09-15 DIAGNOSIS — N400 Enlarged prostate without lower urinary tract symptoms: Secondary | ICD-10-CM | POA: Diagnosis not present

## 2018-09-15 DIAGNOSIS — J309 Allergic rhinitis, unspecified: Secondary | ICD-10-CM | POA: Diagnosis not present

## 2018-09-15 DIAGNOSIS — F419 Anxiety disorder, unspecified: Secondary | ICD-10-CM | POA: Diagnosis not present

## 2018-09-15 DIAGNOSIS — G629 Polyneuropathy, unspecified: Secondary | ICD-10-CM | POA: Diagnosis not present

## 2018-09-15 DIAGNOSIS — I10 Essential (primary) hypertension: Secondary | ICD-10-CM | POA: Diagnosis not present

## 2018-09-15 DIAGNOSIS — K219 Gastro-esophageal reflux disease without esophagitis: Secondary | ICD-10-CM | POA: Diagnosis not present

## 2018-09-20 MED FILL — TAMSULOSIN HCL 0.4 MG CAP: 0.4 | 30 days supply | Qty: 60 | Fill #1

## 2018-09-20 MED FILL — CYCLOBENZAPRINE HCL 10 MG T: 10 | 30 days supply | Qty: 90 | Fill #1

## 2018-09-22 MED FILL — AMLODIPINE-BENAZEPRIL 10-20: 10-20 | 30 days supply | Qty: 30 | Fill #1

## 2018-09-26 MED FILL — ALPRAZolam 1 MG TABS: 1 | 30 days supply | Qty: 45 | Fill #0

## 2018-09-27 MED FILL — HYDROCODON-APAP 5-325: 5-325 | 15 days supply | Qty: 60 | Fill #0

## 2018-10-17 DIAGNOSIS — Z981 Arthrodesis status: Secondary | ICD-10-CM | POA: Diagnosis not present

## 2018-10-17 DIAGNOSIS — M542 Cervicalgia: Secondary | ICD-10-CM | POA: Diagnosis not present

## 2018-10-17 DIAGNOSIS — M5030 Other cervical disc degeneration, unspecified cervical region: Secondary | ICD-10-CM | POA: Diagnosis not present

## 2018-10-19 DIAGNOSIS — L814 Other melanin hyperpigmentation: Secondary | ICD-10-CM | POA: Diagnosis not present

## 2018-10-19 DIAGNOSIS — D2372 Other benign neoplasm of skin of left lower limb, including hip: Secondary | ICD-10-CM | POA: Diagnosis not present

## 2018-10-19 DIAGNOSIS — L239 Allergic contact dermatitis, unspecified cause: Secondary | ICD-10-CM | POA: Diagnosis not present

## 2018-10-19 DIAGNOSIS — D1801 Hemangioma of skin and subcutaneous tissue: Secondary | ICD-10-CM | POA: Diagnosis not present

## 2018-10-19 DIAGNOSIS — D225 Melanocytic nevi of trunk: Secondary | ICD-10-CM | POA: Diagnosis not present

## 2018-10-19 DIAGNOSIS — Z85828 Personal history of other malignant neoplasm of skin: Secondary | ICD-10-CM | POA: Diagnosis not present

## 2018-10-20 MED FILL — CYCLOBENZAPRINE HCL 10 MG T: 10 | 30 days supply | Qty: 90 | Fill #0

## 2018-10-20 MED FILL — TAMSULOSIN HCL 0.4 MG CAP: 0.4 | 30 days supply | Qty: 60 | Fill #2

## 2018-10-25 MED FILL — AMLODIPINE-BENAZEPRIL 10-20: 10-20 | 30 days supply | Qty: 30 | Fill #0

## 2018-10-27 MED FILL — ALPRAZolam 1 MG TABS: 1 | 30 days supply | Qty: 45 | Fill #1

## 2018-10-28 MED FILL — HYDROCODON-APAP 5-325: 5-325 | 15 days supply | Qty: 60 | Fill #0

## 2018-10-31 MED FILL — AMLODIPINE BESYLATE 10 MG T: 10 | 30 days supply | Qty: 30 | Fill #0

## 2018-11-02 MED FILL — BETAMETHASONE DP AUG 0.05%: 0.05 | 20 days supply | Qty: 50 | Fill #0

## 2018-11-18 MED FILL — AMLODIPINE-BENAZEPRIL 10-20: 10-20 | 30 days supply | Qty: 30 | Fill #1

## 2018-11-19 MED FILL — TAMSULOSIN HCL 0.4 MG CAP: 0.4 | 30 days supply | Qty: 60 | Fill #3

## 2018-11-21 MED FILL — busPIRone HCL 30 MG TABS: 30 | 90 days supply | Qty: 180 | Fill #0

## 2018-11-25 MED FILL — HYDROCODON-APAP 5-325: 5-325 | 15 days supply | Qty: 60 | Fill #0

## 2018-11-29 MED FILL — ALPRAZolam 1 MG TABS: 1 | 30 days supply | Qty: 45 | Fill #2

## 2018-12-09 MED FILL — HYDROCODON-APAP 5-325: 5-325 | 15 days supply | Qty: 60 | Fill #0

## 2018-12-19 MED FILL — TAMSULOSIN HCL 0.4 MG CAP: 0.4 | 30 days supply | Qty: 60 | Fill #4

## 2018-12-19 MED FILL — AMLODIPINE-BENAZEPRIL 10-20: 10-20 | 30 days supply | Qty: 30 | Fill #2

## 2018-12-30 MED FILL — PANTOPRAZOLE SOD DR 40 MG T: 40 | 90 days supply | Qty: 180 | Fill #0

## 2018-12-30 MED FILL — CYCLOBENZAPRINE HCL 10 MG T: 10 | 30 days supply | Qty: 90 | Fill #1

## 2018-12-30 MED FILL — ALPRAZolam 1 MG TABS: 1 | 30 days supply | Qty: 45 | Fill #3

## 2019-01-17 MED FILL — AMLODIPINE-BENAZEPRIL 10-20: 10-20 | 30 days supply | Qty: 30 | Fill #3

## 2019-01-18 MED FILL — TAMSULOSIN HCL 0.4 MG CAP: 0.4 | 30 days supply | Qty: 60 | Fill #0

## 2019-01-27 MED FILL — ALPRAZolam 1 MG TABS: 1 | 30 days supply | Qty: 45 | Fill #4

## 2019-02-07 MED FILL — HYDROCODON-APAP 5-325: 5-325 | 15 days supply | Qty: 60 | Fill #0

## 2019-02-13 MED FILL — busPIRone HCL 30 MG TABS: 30 | 90 days supply | Qty: 180 | Fill #1

## 2019-02-13 MED FILL — CYCLOBENZAPRINE HCL 10 MG T: 10 | 30 days supply | Qty: 90 | Fill #2

## 2019-02-15 DIAGNOSIS — I10 Essential (primary) hypertension: Secondary | ICD-10-CM | POA: Diagnosis not present

## 2019-02-15 DIAGNOSIS — M1990 Unspecified osteoarthritis, unspecified site: Secondary | ICD-10-CM | POA: Diagnosis not present

## 2019-02-15 DIAGNOSIS — M5090 Cervical disc disorder, unspecified, unspecified cervical region: Secondary | ICD-10-CM | POA: Diagnosis not present

## 2019-02-15 DIAGNOSIS — Z1389 Encounter for screening for other disorder: Secondary | ICD-10-CM | POA: Diagnosis not present

## 2019-02-15 DIAGNOSIS — Z0000 Encounter for general adult medical examination without abnormal findings: Secondary | ICD-10-CM | POA: Diagnosis not present

## 2019-02-15 DIAGNOSIS — Z23 Encounter for immunization: Secondary | ICD-10-CM | POA: Diagnosis not present

## 2019-02-15 DIAGNOSIS — F419 Anxiety disorder, unspecified: Secondary | ICD-10-CM | POA: Diagnosis not present

## 2019-02-15 DIAGNOSIS — K219 Gastro-esophageal reflux disease without esophagitis: Secondary | ICD-10-CM | POA: Diagnosis not present

## 2019-02-15 DIAGNOSIS — E782 Mixed hyperlipidemia: Secondary | ICD-10-CM | POA: Diagnosis not present

## 2019-02-15 DIAGNOSIS — G8929 Other chronic pain: Secondary | ICD-10-CM | POA: Diagnosis not present

## 2019-02-15 DIAGNOSIS — N400 Enlarged prostate without lower urinary tract symptoms: Secondary | ICD-10-CM | POA: Diagnosis not present

## 2019-02-15 MED FILL — TAMSULOSIN HCL 0.4 MG CAP: 0.4 | 90 days supply | Qty: 180 | Fill #0

## 2019-02-16 MED FILL — AMLODIPINE-BENAZEPRIL 10-20: 10-20 | 30 days supply | Qty: 30 | Fill #0

## 2019-03-04 MED FILL — BETAMETHASONE DP AUG 0.05%: 0.05 | 20 days supply | Qty: 50 | Fill #1

## 2019-03-13 MED FILL — AMLODIPINE-BENAZEPRIL 10-20: 10-20 | 30 days supply | Qty: 30 | Fill #1

## 2019-03-14 MED FILL — HYDROCODON-APAP 5-325: 5-325 | 15 days supply | Qty: 60 | Fill #0

## 2019-03-22 MED FILL — ALPRAZolam 1 MG TABS: 1 | 30 days supply | Qty: 45 | Fill #0

## 2019-03-24 MED FILL — PANTOPRAZOLE SOD DR 40 MG T: 40 | 90 days supply | Qty: 180 | Fill #1

## 2019-04-03 MED FILL — CYCLOBENZAPRINE HCL 10 MG T: 10 | 30 days supply | Qty: 90 | Fill #3

## 2019-04-04 DIAGNOSIS — N400 Enlarged prostate without lower urinary tract symptoms: Secondary | ICD-10-CM | POA: Diagnosis not present

## 2019-04-04 MED FILL — ALFUZOSIN HCL ER 10 MG TB24: 10 | 30 days supply | Qty: 30 | Fill #0

## 2019-04-17 MED FILL — BETAMETHASONE DP AUG 0.05%: 0.05 | 20 days supply | Qty: 50 | Fill #2

## 2019-04-17 MED FILL — AMLODIPINE-BENAZEPRIL 10-20: 10-20 | 30 days supply | Qty: 30 | Fill #0

## 2019-04-18 MED FILL — HYDROCODON-APAP 5-325: 5-325 | 15 days supply | Qty: 60 | Fill #0

## 2019-04-21 DIAGNOSIS — M545 Low back pain: Secondary | ICD-10-CM | POA: Diagnosis not present

## 2019-04-21 DIAGNOSIS — I10 Essential (primary) hypertension: Secondary | ICD-10-CM | POA: Diagnosis not present

## 2019-04-21 DIAGNOSIS — M542 Cervicalgia: Secondary | ICD-10-CM | POA: Diagnosis not present

## 2019-04-21 DIAGNOSIS — M5136 Other intervertebral disc degeneration, lumbar region: Secondary | ICD-10-CM | POA: Diagnosis not present

## 2019-04-21 DIAGNOSIS — M5030 Other cervical disc degeneration, unspecified cervical region: Secondary | ICD-10-CM | POA: Diagnosis not present

## 2019-04-21 DIAGNOSIS — M47816 Spondylosis without myelopathy or radiculopathy, lumbar region: Secondary | ICD-10-CM | POA: Diagnosis not present

## 2019-04-21 DIAGNOSIS — M5416 Radiculopathy, lumbar region: Secondary | ICD-10-CM | POA: Diagnosis not present

## 2019-04-21 DIAGNOSIS — M4722 Other spondylosis with radiculopathy, cervical region: Secondary | ICD-10-CM | POA: Diagnosis not present

## 2019-04-21 DIAGNOSIS — Z981 Arthrodesis status: Secondary | ICD-10-CM | POA: Diagnosis not present

## 2019-04-24 MED FILL — ALPRAZolam 1 MG TABS: 1 | 30 days supply | Qty: 45 | Fill #0

## 2019-04-28 MED FILL — CYCLOBENZAPRINE HCL 10 MG T: 10 | 30 days supply | Qty: 90 | Fill #4

## 2019-05-02 DIAGNOSIS — H6123 Impacted cerumen, bilateral: Secondary | ICD-10-CM | POA: Diagnosis not present

## 2019-05-02 DIAGNOSIS — J310 Chronic rhinitis: Secondary | ICD-10-CM | POA: Diagnosis not present

## 2019-05-02 DIAGNOSIS — H6983 Other specified disorders of Eustachian tube, bilateral: Secondary | ICD-10-CM | POA: Diagnosis not present

## 2019-05-02 DIAGNOSIS — H903 Sensorineural hearing loss, bilateral: Secondary | ICD-10-CM | POA: Diagnosis not present

## 2019-05-02 DIAGNOSIS — Z87891 Personal history of nicotine dependence: Secondary | ICD-10-CM | POA: Diagnosis not present

## 2019-05-02 DIAGNOSIS — Z7289 Other problems related to lifestyle: Secondary | ICD-10-CM | POA: Diagnosis not present

## 2019-05-02 DIAGNOSIS — J342 Deviated nasal septum: Secondary | ICD-10-CM | POA: Diagnosis not present

## 2019-05-02 DIAGNOSIS — Z8669 Personal history of other diseases of the nervous system and sense organs: Secondary | ICD-10-CM | POA: Diagnosis not present

## 2019-05-02 DIAGNOSIS — Z974 Presence of external hearing-aid: Secondary | ICD-10-CM | POA: Diagnosis not present

## 2019-05-02 DIAGNOSIS — H90A31 Mixed conductive and sensorineural hearing loss, unilateral, right ear with restricted hearing on the contralateral side: Secondary | ICD-10-CM | POA: Diagnosis not present

## 2019-05-02 DIAGNOSIS — H90A22 Sensorineural hearing loss, unilateral, left ear, with restricted hearing on the contralateral side: Secondary | ICD-10-CM | POA: Diagnosis not present

## 2019-05-02 MED FILL — FLUTICASONE PROP 50 MCG SPR: 50 | 30 days supply | Qty: 16 | Fill #0

## 2019-05-09 DIAGNOSIS — H43393 Other vitreous opacities, bilateral: Secondary | ICD-10-CM | POA: Diagnosis not present

## 2019-05-09 DIAGNOSIS — H02834 Dermatochalasis of left upper eyelid: Secondary | ICD-10-CM | POA: Diagnosis not present

## 2019-05-09 DIAGNOSIS — H02831 Dermatochalasis of right upper eyelid: Secondary | ICD-10-CM | POA: Diagnosis not present

## 2019-05-09 DIAGNOSIS — H2513 Age-related nuclear cataract, bilateral: Secondary | ICD-10-CM | POA: Diagnosis not present

## 2019-05-12 MED FILL — TAMSULOSIN HCL 0.4 MG CAP: 0.4 | 90 days supply | Qty: 180 | Fill #0

## 2019-05-15 MED FILL — AMLODIPINE-BENAZEPRIL 10-20: 10-20 | 30 days supply | Qty: 30 | Fill #1

## 2019-05-15 MED FILL — BUSPIRONE HCL 30 MG TABS: 30 | 90 days supply | Qty: 180 | Fill #2

## 2019-05-15 MED FILL — TAMSULOSIN HCL 0.4 MG CAP: 0.4 | 90 days supply | Qty: 180 | Fill #0 | Status: TO

## 2019-05-15 MED FILL — TAMSULOSIN HCL 0.4 MG CAP: 0.4 | 90 days supply | Qty: 180 | Fill #0

## 2019-05-23 DIAGNOSIS — H90A22 Sensorineural hearing loss, unilateral, left ear, with restricted hearing on the contralateral side: Secondary | ICD-10-CM | POA: Diagnosis not present

## 2019-05-23 DIAGNOSIS — H90A31 Mixed conductive and sensorineural hearing loss, unilateral, right ear with restricted hearing on the contralateral side: Secondary | ICD-10-CM | POA: Diagnosis not present

## 2019-05-23 MED FILL — ALPRAZolam 1 MG TABS: 1 | 30 days supply | Qty: 45 | Fill #1

## 2019-05-23 MED FILL — CYCLOBENZAPRINE HCL 10 MG T: 10 | 30 days supply | Qty: 90 | Fill #5

## 2019-06-01 MED FILL — HYDROCODON-APAP 5-325: 5-325 | 15 days supply | Qty: 60 | Fill #0

## 2019-06-09 MED FILL — BETAMETHASONE DP AUG 0.05%: 0.05 | 25 days supply | Qty: 50 | Fill #0

## 2019-06-17 MED FILL — AMLODIPINE-BENAZEPRIL 10-20: 10-20 | 30 days supply | Qty: 30 | Fill #2

## 2019-06-17 MED FILL — FLUTICASONE PROP 50 MCG SPR: 50 | 30 days supply | Qty: 16 | Fill #1

## 2019-06-22 MED FILL — ALPRAZolam 1 MG TABS: 1 | 30 days supply | Qty: 45 | Fill #2

## 2019-06-29 MED FILL — PANTOPRAZOLE SOD DR 40 MG T: 40 | 90 days supply | Qty: 180 | Fill #2

## 2019-07-10 MED FILL — HYDROCODON-APAP 5-325: 5-325 | 15 days supply | Qty: 60 | Fill #0

## 2019-07-10 MED FILL — CYCLOBENZAPRINE HCL 10 MG T: 10 | 30 days supply | Qty: 90 | Fill #0

## 2019-07-25 MED FILL — FLUTICASONE PROP 50 MCG SPR: 50 | 30 days supply | Qty: 16 | Fill #2

## 2019-07-25 MED FILL — AMLODIPINE-BENAZEPRIL 10-20: 10-20 | 30 days supply | Qty: 30 | Fill #3

## 2019-07-25 MED FILL — ALPRAZolam 1 MG TABS: 1 | 30 days supply | Qty: 45 | Fill #3

## 2019-07-25 MED FILL — BETAMETHASONE DP AUG 0.05%: 0.05 | 25 days supply | Qty: 50 | Fill #1

## 2019-08-03 ENCOUNTER — Telehealth: Payer: 59 | Admitting: Physician Assistant

## 2019-08-03 DIAGNOSIS — J039 Acute tonsillitis, unspecified: Secondary | ICD-10-CM | POA: Diagnosis not present

## 2019-08-03 MED ORDER — AMOXICILLIN 500 MG PO CAPS: 500.0000 mg | ORAL_CAPSULE | Freq: Two times a day (BID) | ORAL | 0 refills | Status: DC

## 2019-08-03 MED FILL — AMOXICILLIN 500 MG CAPSULE: 500 | 10 days supply | Qty: 20 | Fill #0

## 2019-08-03 NOTE — Progress Notes (Signed)

## 2019-08-08 MED FILL — CYCLOBENZAPRINE HCL 10 MG T: 10 | 30 days supply | Qty: 90 | Fill #1

## 2019-08-08 MED FILL — TAMSULOSIN HCL 0.4 MG CAP: 0.4 | 90 days supply | Qty: 180 | Fill #1

## 2019-08-08 MED FILL — BUSPIRONE HCL 30 MG TABS: 30 | 90 days supply | Qty: 180 | Fill #3

## 2019-08-15 MED FILL — HYDROCODON-APAP 5-325: 5-325 | 15 days supply | Qty: 60 | Fill #0

## 2019-08-17 DIAGNOSIS — M1990 Unspecified osteoarthritis, unspecified site: Secondary | ICD-10-CM | POA: Diagnosis not present

## 2019-08-17 DIAGNOSIS — I10 Essential (primary) hypertension: Secondary | ICD-10-CM | POA: Diagnosis not present

## 2019-08-17 DIAGNOSIS — N400 Enlarged prostate without lower urinary tract symptoms: Secondary | ICD-10-CM | POA: Diagnosis not present

## 2019-08-17 DIAGNOSIS — E782 Mixed hyperlipidemia: Secondary | ICD-10-CM | POA: Diagnosis not present

## 2019-08-17 DIAGNOSIS — G8929 Other chronic pain: Secondary | ICD-10-CM | POA: Diagnosis not present

## 2019-08-17 DIAGNOSIS — F419 Anxiety disorder, unspecified: Secondary | ICD-10-CM | POA: Diagnosis not present

## 2019-08-17 DIAGNOSIS — K219 Gastro-esophageal reflux disease without esophagitis: Secondary | ICD-10-CM | POA: Diagnosis not present

## 2019-09-03 MED FILL — FLUTICASONE PROP 50 MCG SPR: 50 | 30 days supply | Qty: 16 | Fill #3

## 2019-09-04 MED FILL — AMLODIPINE-BENAZEPRIL 10-20: 10-20 | 30 days supply | Qty: 30 | Fill #2

## 2019-09-04 MED FILL — ALPRAZolam 1 MG TABS: 1 | 30 days supply | Qty: 45 | Fill #0

## 2019-09-04 MED FILL — BETAMETHASONE DP AUG 0.05%: 0.05 | 25 days supply | Qty: 50 | Fill #2

## 2019-09-04 MED FILL — CYCLOBENZAPRINE HCL 10 MG T: 10 | 30 days supply | Qty: 90 | Fill #2

## 2019-09-13 DIAGNOSIS — M5136 Other intervertebral disc degeneration, lumbar region: Secondary | ICD-10-CM | POA: Diagnosis not present

## 2019-09-13 DIAGNOSIS — M4722 Other spondylosis with radiculopathy, cervical region: Secondary | ICD-10-CM | POA: Diagnosis not present

## 2019-09-13 DIAGNOSIS — Z6827 Body mass index (BMI) 27.0-27.9, adult: Secondary | ICD-10-CM | POA: Diagnosis not present

## 2019-09-13 DIAGNOSIS — M5030 Other cervical disc degeneration, unspecified cervical region: Secondary | ICD-10-CM | POA: Diagnosis not present

## 2019-09-13 DIAGNOSIS — I10 Essential (primary) hypertension: Secondary | ICD-10-CM | POA: Diagnosis not present

## 2019-09-13 DIAGNOSIS — R030 Elevated blood-pressure reading, without diagnosis of hypertension: Secondary | ICD-10-CM | POA: Diagnosis not present

## 2019-09-13 DIAGNOSIS — Z981 Arthrodesis status: Secondary | ICD-10-CM | POA: Diagnosis not present

## 2019-09-13 MED FILL — HYDROCODON-APAP 5-325: 5-325 | 20 days supply | Qty: 60 | Fill #0

## 2019-10-03 MED FILL — ALPRAZolam 1 MG TABS: 1 | 30 days supply | Qty: 45 | Fill #1

## 2019-10-03 MED FILL — CYCLOBENZAPRINE HCL 10 MG T: 10 | 30 days supply | Qty: 90 | Fill #3

## 2019-10-03 MED FILL — PANTOPRAZOLE SOD DR 40 MG T: 40 | 90 days supply | Qty: 180 | Fill #0

## 2019-10-03 MED FILL — FLUTICASONE PROP 50 MCG SPR: 50 | 30 days supply | Qty: 16 | Fill #4

## 2019-10-03 MED FILL — AMLODIPINE-BENAZEPRIL 10-20: 10-20 | 30 days supply | Qty: 30 | Fill #3

## 2019-10-31 MED FILL — BUSPIRONE HCL 30 MG TABS: 30 | 90 days supply | Qty: 180 | Fill #4

## 2019-10-31 MED FILL — AMLODIPINE-BENAZEPRIL 10-20: 10-20 | 30 days supply | Qty: 30 | Fill #0

## 2019-10-31 MED FILL — ALPRAZolam 1 MG TABS: 1 | 30 days supply | Qty: 45 | Fill #2

## 2019-10-31 MED FILL — TAMSULOSIN HCL 0.4 MG CAP: 0.4 | 90 days supply | Qty: 180 | Fill #2

## 2019-10-31 MED FILL — CYCLOBENZAPRINE HCL 10 MG T: 10 | 30 days supply | Qty: 90 | Fill #4

## 2019-10-31 MED FILL — FLUTICASONE PROP 50 MCG SPR: 50 | 30 days supply | Qty: 16 | Fill #5

## 2019-11-01 MED FILL — BETAMETHASONE DP AUG 0.05%: 0.05 | 20 days supply | Qty: 50 | Fill #0

## 2019-11-08 DIAGNOSIS — M5030 Other cervical disc degeneration, unspecified cervical region: Secondary | ICD-10-CM | POA: Diagnosis not present

## 2019-11-08 DIAGNOSIS — Z981 Arthrodesis status: Secondary | ICD-10-CM | POA: Diagnosis not present

## 2019-11-08 DIAGNOSIS — F1120 Opioid dependence, uncomplicated: Secondary | ICD-10-CM | POA: Diagnosis not present

## 2019-11-08 DIAGNOSIS — M5136 Other intervertebral disc degeneration, lumbar region: Secondary | ICD-10-CM | POA: Diagnosis not present

## 2019-11-13 DIAGNOSIS — R519 Headache, unspecified: Secondary | ICD-10-CM | POA: Diagnosis not present

## 2019-11-13 DIAGNOSIS — I10 Essential (primary) hypertension: Secondary | ICD-10-CM | POA: Diagnosis not present

## 2019-11-13 DIAGNOSIS — W57XXXA Bitten or stung by nonvenomous insect and other nonvenomous arthropods, initial encounter: Secondary | ICD-10-CM | POA: Diagnosis not present

## 2019-11-13 DIAGNOSIS — Z7689 Persons encountering health services in other specified circumstances: Secondary | ICD-10-CM | POA: Diagnosis not present

## 2019-11-13 MED FILL — AMLODIPINE BESYLATE 5 MG TA: 5 | 90 days supply | Qty: 90 | Fill #0

## 2019-11-27 MED FILL — BETAMETHASONE DP AUG 0.05%: 0.05 | 20 days supply | Qty: 50 | Fill #1

## 2019-11-27 MED FILL — ALPRAZolam 1 MG TABS: 1 | 30 days supply | Qty: 45 | Fill #3

## 2019-11-27 MED FILL — CYCLOBENZAPRINE HCL 10 MG T: 10 | 30 days supply | Qty: 90 | Fill #5

## 2019-11-27 MED FILL — FLUTICASONE PROP 50 MCG SPR: 50 | 30 days supply | Qty: 16 | Fill #6

## 2019-12-13 ENCOUNTER — Other Ambulatory Visit (HOSPITAL_COMMUNITY): Payer: Self-pay | Admitting: Internal Medicine

## 2019-12-13 DIAGNOSIS — I10 Essential (primary) hypertension: Secondary | ICD-10-CM | POA: Diagnosis not present

## 2019-12-13 DIAGNOSIS — J309 Allergic rhinitis, unspecified: Secondary | ICD-10-CM | POA: Diagnosis not present

## 2019-12-13 DIAGNOSIS — R062 Wheezing: Secondary | ICD-10-CM | POA: Diagnosis not present

## 2019-12-13 MED FILL — ALBUTEROL SULFATE HFA 108 (: 108 (90 BAS | 16 days supply | Qty: 18 | Fill #0

## 2019-12-14 MED FILL — HYDROCODON-APAP 5-325: 5-325 | 20 days supply | Qty: 60 | Fill #0

## 2019-12-26 MED FILL — CYCLOBENZAPRINE HCL 10 MG T: 10 | 30 days supply | Qty: 90 | Fill #0

## 2019-12-26 MED FILL — FLUTICASONE PROP 50 MCG SPR: 50 | 30 days supply | Qty: 16 | Fill #7

## 2019-12-26 MED FILL — ALPRAZOLAM 1 MG TABS: 1 | 30 days supply | Qty: 45 | Fill #0

## 2019-12-26 MED FILL — BETAMETHASONE DP AUG 0.05%: 0.05 | 20 days supply | Qty: 50 | Fill #2

## 2020-01-04 ENCOUNTER — Other Ambulatory Visit (HOSPITAL_COMMUNITY): Payer: Self-pay | Admitting: Internal Medicine

## 2020-01-09 MED FILL — AMLODIPINE-BENAZEPRIL 5-10: 5-10 | 30 days supply | Qty: 30 | Fill #0

## 2020-01-09 MED FILL — HYDROCODON-APAP 5-325: 5-325 | 20 days supply | Qty: 60 | Fill #0

## 2020-01-09 MED FILL — ALBUTEROL SULFATE HFA 108 (: 108 (90 BAS | 16 days supply | Qty: 18 | Fill #1

## 2020-01-24 DIAGNOSIS — M5030 Other cervical disc degeneration, unspecified cervical region: Secondary | ICD-10-CM | POA: Diagnosis not present

## 2020-01-24 DIAGNOSIS — M5136 Other intervertebral disc degeneration, lumbar region: Secondary | ICD-10-CM | POA: Diagnosis not present

## 2020-01-24 DIAGNOSIS — F1120 Opioid dependence, uncomplicated: Secondary | ICD-10-CM | POA: Diagnosis not present

## 2020-01-24 MED FILL — ALPRAZOLAM 1 MG TABS: 1 | 30 days supply | Qty: 45 | Fill #1

## 2020-01-24 MED FILL — FLUTICASONE PROP 50 MCG SPR: 50 | 30 days supply | Qty: 16 | Fill #8

## 2020-01-24 MED FILL — CYCLOBENZAPRINE HCL 10 MG T: 10 | 30 days supply | Qty: 90 | Fill #1

## 2020-02-11 MED FILL — ALBUTEROL SULFATE HFA 108 (: 108 (90 BAS | 16 days supply | Qty: 18 | Fill #2

## 2020-02-11 MED FILL — PANTOPRAZOLE SOD DR 40 MG T: 40 | 90 days supply | Qty: 180 | Fill #1

## 2020-02-12 ENCOUNTER — Other Ambulatory Visit (HOSPITAL_COMMUNITY): Payer: Self-pay | Admitting: Internal Medicine

## 2020-02-12 MED FILL — AMLODIPINE-BENAZEPRIL 5-10: 5-10 | 30 days supply | Qty: 30 | Fill #1

## 2020-02-12 MED FILL — HYDROCODON-APAP 5-325: 5-325 | 20 days supply | Qty: 60 | Fill #0

## 2020-02-12 MED FILL — BUSPIRONE HCL 30 MG TABS: 30 | 90 days supply | Qty: 180 | Fill #0

## 2020-02-14 DIAGNOSIS — Z131 Encounter for screening for diabetes mellitus: Secondary | ICD-10-CM | POA: Diagnosis not present

## 2020-02-14 DIAGNOSIS — J309 Allergic rhinitis, unspecified: Secondary | ICD-10-CM | POA: Diagnosis not present

## 2020-02-14 DIAGNOSIS — Z23 Encounter for immunization: Secondary | ICD-10-CM | POA: Diagnosis not present

## 2020-02-14 DIAGNOSIS — Z1389 Encounter for screening for other disorder: Secondary | ICD-10-CM | POA: Diagnosis not present

## 2020-02-14 DIAGNOSIS — E782 Mixed hyperlipidemia: Secondary | ICD-10-CM | POA: Diagnosis not present

## 2020-02-14 DIAGNOSIS — I10 Essential (primary) hypertension: Secondary | ICD-10-CM | POA: Diagnosis not present

## 2020-02-14 DIAGNOSIS — Z125 Encounter for screening for malignant neoplasm of prostate: Secondary | ICD-10-CM | POA: Diagnosis not present

## 2020-02-14 DIAGNOSIS — N400 Enlarged prostate without lower urinary tract symptoms: Secondary | ICD-10-CM | POA: Diagnosis not present

## 2020-02-14 DIAGNOSIS — G8929 Other chronic pain: Secondary | ICD-10-CM | POA: Diagnosis not present

## 2020-02-14 DIAGNOSIS — F419 Anxiety disorder, unspecified: Secondary | ICD-10-CM | POA: Diagnosis not present

## 2020-02-14 DIAGNOSIS — Z0000 Encounter for general adult medical examination without abnormal findings: Secondary | ICD-10-CM | POA: Diagnosis not present

## 2020-02-14 DIAGNOSIS — M5090 Cervical disc disorder, unspecified, unspecified cervical region: Secondary | ICD-10-CM | POA: Diagnosis not present

## 2020-02-22 DIAGNOSIS — H43393 Other vitreous opacities, bilateral: Secondary | ICD-10-CM | POA: Diagnosis not present

## 2020-02-23 MED FILL — ALPRAZOLAM 1 MG TABS: 1 | 30 days supply | Qty: 45 | Fill #2

## 2020-02-23 MED FILL — CYCLOBENZAPRINE HCL 10 MG T: 10 | 30 days supply | Qty: 90 | Fill #0

## 2020-02-26 ENCOUNTER — Ambulatory Visit: Payer: 59 | Admitting: Cardiology

## 2020-02-26 DIAGNOSIS — B079 Viral wart, unspecified: Secondary | ICD-10-CM | POA: Diagnosis not present

## 2020-02-26 DIAGNOSIS — L821 Other seborrheic keratosis: Secondary | ICD-10-CM | POA: Diagnosis not present

## 2020-02-26 DIAGNOSIS — D224 Melanocytic nevi of scalp and neck: Secondary | ICD-10-CM | POA: Diagnosis not present

## 2020-02-26 DIAGNOSIS — D485 Neoplasm of uncertain behavior of skin: Secondary | ICD-10-CM | POA: Diagnosis not present

## 2020-02-26 DIAGNOSIS — D1801 Hemangioma of skin and subcutaneous tissue: Secondary | ICD-10-CM | POA: Diagnosis not present

## 2020-02-26 DIAGNOSIS — Z85828 Personal history of other malignant neoplasm of skin: Secondary | ICD-10-CM | POA: Diagnosis not present

## 2020-02-26 DIAGNOSIS — L820 Inflamed seborrheic keratosis: Secondary | ICD-10-CM | POA: Diagnosis not present

## 2020-03-10 NOTE — Progress Notes (Signed)
Cardiology Office Note:    Date:  03/12/2020   ID:  [name], DOB 05/20/1961, MRN [id number]  PCP:  Wenda Low, MD  Cardiologist:  No primary care provider on file.  Electrophysiologist:  None   Referring MD: Wenda Low, MD   Chief Complaint  Patient presents with  . Chest Pain    History of Present Illness:    [name] is a 59 y.o. male with a hx of hyperlipidemia, hypertension, rheumatoid arthritis, hiatal hernia, OSA who is referred by Dr. Deforest Hoyles for evaluation of chest pain.  He reports that he has been having intermittent chest pain for years but about 1 month ago started having sharp pain on left side of his chest.  States the pain will be 8 out of 10 in intensity, typically last for 5 to 8 minutes.  Has been occurring about once per week.  Occurs at rest.  He is very active, works on a farm.  Has not noted chest pain with exertion though does report he gets short of breath with exertion.  He denies any lightheadedness or syncope.  Reports rare palpitations that he describes as fluttering in chest, typically lasts for few seconds and resolves.  He is a former smoker, smoked 3 packs/day x 30 years, quit in 2006.  Family history includes mother died from MI in 55s.  Lexiscan Myoview on 01/24/2016 showed normal perfusion, EF 59%.  Past Medical History:  Diagnosis Date  . Anxiety   . Arthritis    neck and hands  . DJD (degenerative joint disease), cervical   . Dyslipidemia   . Fatty liver   . GERD (gastroesophageal reflux disease)   . H/O hiatal hernia   . MRSA (methicillin resistant Staphylococcus aureus)    boil on abdomine in 2006  . Peripheral neuropathy ([place])   . Sleep apnea    breath right strips    Past Surgical History:  Procedure Laterality Date  . BACK SURGERY     neck surgery X2  . GANGLION CYST EXCISION     right hand  . left elbow surgery torn muscle    . right ear surgery      Current Medications: Current Meds  Medication Sig  .  albuterol (PROVENTIL HFA;VENTOLIN HFA) 108 (90 BASE) MCG/ACT inhaler Inhale 2 puffs into the lungs every 6 (six) hours as needed for wheezing or shortness of breath.  . ALPRAZolam (XANAX) 1 MG tablet Take 1 mg by mouth as needed.  Marland Kitchen aspirin 81 MG EC tablet Take 81 mg by mouth daily.  . busPIRone (BUSPAR) 10 MG tablet Take 10 mg by mouth 3 (three) times daily.  . ciprofloxacin-hydrocortisone (CIPRO HC) OTIC suspension Place 3 drops into the right ear 2 (two) times daily.  . cyclobenzaprine (FLEXERIL) 10 MG tablet Take 10 mg by mouth 3 (three) times daily as needed.  Marland Kitchen HYDROcodone-acetaminophen (VICODIN) 5-500 MG per tablet Take 1 tablet by mouth every 6 (six) hours as needed.  . neomycin-polymyxin-hydrocortisone (CORTISPORIN) OTIC solution Place 4 drops into the right ear 4 (four) times daily.  . pantoprazole (PROTONIX) 40 MG tablet Take 30-60 min before first meal of the day  . tamsulosin (FLOMAX) 0.4 MG CAPS capsule Take 1 capsule by mouth in the morning and at bedtime.     Allergies:   Voltaren [diclofenac sodium], Crestor [rosuvastatin], Lyrica [pregabalin], and Simvastatin   Social History   Socioeconomic History  . Marital status: Married    Spouse name: Not on file  .  Number of children: Not on file  . Years of education: Not on file  . Highest education level: Not on file  Occupational History  . Not on file  Tobacco Use  . Smoking status: Former Smoker    Packs/day: 3.00    Years: 30.00    Pack years: 90.00    Types: Cigarettes    Quit date: 08/11/2006    Years since quitting: 13.5  . Smokeless tobacco: Never Used  Substance and Sexual Activity  . Alcohol use: Yes    Alcohol/week: 4.0 standard drinks    Types: 4 Cans of beer per week  . Drug use: No  . Sexual activity: Not on file  Other Topics Concern  . Not on file  Social History Narrative  . Not on file   Social Determinants of Health   Financial Resource Strain:   . Difficulty of Paying Living Expenses: Not  on file  Food Insecurity:   . Worried About Charity fundraiser in the Last Year: Not on file  . Ran Out of Food in the Last Year: Not on file  Transportation Needs:   . Lack of Transportation (Medical): Not on file  . Lack of Transportation (Non-Medical): Not on file  Physical Activity:   . Days of Exercise per Week: Not on file  . Minutes of Exercise per Session: Not on file  Stress:   . Feeling of Stress : Not on file  Social Connections:   . Frequency of Communication with Friends and Family: Not on file  . Frequency of Social Gatherings with Friends and Family: Not on file  . Attends Religious Services: Not on file  . Active Member of Clubs or Organizations: Not on file  . Attends Archivist Meetings: Not on file  . Marital Status: Not on file     Family History: The patient's family history includes COPD in his mother; Heart failure in his mother; Tuberculosis in his father.  ROS:   Please see the history of present illness.     All other systems reviewed and are negative.  EKGs/Labs/Other Studies Reviewed:    The following studies were reviewed today:   EKG:  EKG is  ordered today.  The ekg ordered today demonstrates normal sinus rhythm, rate 75, no ST/T abnormalities  Recent Labs: No results found for requested labs within last 8760 hours.  Recent Lipid Panel No results found for: CHOL, TRIG, HDL, CHOLHDL, VLDL, LDLCALC, LDLDIRECT  Physical Exam:    VS:  BP 130/72   Pulse 75   Ht 5' 11"  (1.803 m)   Wt 204 lb (92.5 kg)   BMI 28.45 kg/m     Wt Readings from Last 3 Encounters:  03/12/20 204 lb (92.5 kg)  05/22/16 195 lb (88.5 kg)  06/29/13 198 lb (89.8 kg)     GEN:  Well nourished, well developed in no acute distress HEENT: Normal NECK: No JVD; No carotid bruits LYMPHATICS: No lymphadenopathy CARDIAC: RRR, no murmurs, rubs, gallops RESPIRATORY:  Clear to auscultation without rales, wheezing or rhonchi  ABDOMEN: Soft, non-tender,  non-distended MUSCULOSKELETAL:  No edema; No deformity  SKIN: Warm and dry NEUROLOGIC:  Alert and oriented x 3 PSYCHIATRIC:  Normal affect   ASSESSMENT:    1. Chest pain of uncertain etiology   2. Essential hypertension   3. Hyperlipidemia, unspecified hyperlipidemia type    PLAN:    Chest pain: Atypical in description but does have significant CAD risk factors (age, tobacco use,  hypertension, hyperlipidemia) and warrants further evaluation to rule out obstructive CAD -Coronary CTA, will give Lopressor 100 mg prior to exam -Echocardiogram  Hypertension: On amlodipine-benazepril 10-20 mg daily.  Appears controlled.  Hyperlipidemia: Has been unable to tolerate rosuvastatin and simvastatin.  Was prescribed lovastatin 10 mg daily but reports has not been taking.  LDL 136 on 02/14/2020.  Will follow up results of coronary CTA to guide cholesterol management  RTC in 3 months  Medication Adjustments/Labs and Tests Ordered: Current medicines are reviewed at length with the patient today.  Concerns regarding medicines are outlined above.  Orders Placed This Encounter  Procedures  . CT CORONARY MORPH W/CTA COR W/SCORE W/CA W/CM &/OR WO/CM  . CT CORONARY FRACTIONAL FLOW RESERVE DATA PREP  . CT CORONARY FRACTIONAL FLOW RESERVE FLUID ANALYSIS  . EKG 12-Lead  . ECHOCARDIOGRAM COMPLETE   Meds ordered this encounter  Medications  . metoprolol tartrate (LOPRESSOR) 100 MG tablet    Sig: Take 100 mg (1 tablet) TWO hours prior to CT    Dispense:  1 tablet    Refill:  0    Patient Instructions  Medication Instructions:  Your physician recommends that you continue on your current medications as directed. Please refer to the Current Medication list given to you today.  Testing/Procedures: Your physician has requested that you have an echocardiogram. Echocardiography is a painless test that uses sound waves to create images of your heart. It provides your doctor with information about the size  and shape of your heart and how well your heart's chambers and valves are working. This procedure takes approximately one hour. There are no restrictions for this procedure.  This will be done at our [facility] location:  [address]  Coronary CTA-see instruction sheet  Follow-Up: At [facility], you and your health needs are our priority.  As part of our continuing mission to provide you with exceptional heart care, we have created designated Provider Care Teams.  These Care Teams include your primary Cardiologist (physician) and Advanced Practice Providers (APPs -  Physician Assistants and Nurse Practitioners) who all work together to provide you with the care you need, when you need it.  We recommend signing up for the patient portal called "MyChart".  Sign up information is provided on this After Visit Summary.  MyChart is used to connect with patients for Virtual Visits (Telemedicine).  Patients are able to view lab/test results, encounter notes, upcoming appointments, etc.  Non-urgent messages can be sent to your provider as well.   To learn more about what you can do with MyChart, go to [email or web address].    Your next appointment:   3 month(s)  The format for your next appointment:   In Person  Provider:   Oswaldo Milian, MD   Other Instructions Your cardiac CT will be scheduled at one of the below locations:   [facility] [address] [place], [place] [address] [phone]  [place] [address] [place], [place] [address] [phone]  If scheduled at [facility], please arrive at the [facility] main entrance of [facility] 30 minutes prior to test start time. Proceed to the [facility] Radiology Department (first floor) to check-in and test prep.  If scheduled at [facility], please arrive 15 mins early for check-in and  test prep.  Please follow these instructions carefully (unless otherwise directed):   On the Night Before  the Test: . Be sure to Drink plenty of water. . Do not consume any caffeinated/decaffeinated beverages or chocolate 12 hours prior to your test. . Do not take any antihistamines 12 hours prior to your test.  On the Day of the Test: . Drink plenty of water. Do not drink any water within one hour of the test. . Do not eat any food 4 hours prior to the test. . You may take your regular medications prior to the test.  . Take metoprolol (Lopressor) two hours prior to test.     After the Test: . Drink plenty of water. . After receiving IV contrast, you may experience a mild flushed feeling. This is normal. . On occasion, you may experience a mild rash up to 24 hours after the test. This is not dangerous. If this occurs, you can take Benadryl 25 mg and increase your fluid intake. . If you experience trouble breathing, this can be serious. If it is severe call 911 IMMEDIATELY. If it is mild, please call our office. . If you take any of these medications: Glipizide/Metformin, Avandament, Glucavance, please do not take 48 hours after completing test unless otherwise instructed.   Once we have confirmed authorization from your insurance company, we will call you to set up a date and time for your test. Based on how quickly your insurance processes prior authorizations requests, please allow up to 4 weeks to be contacted for scheduling your Cardiac CT appointment. Be advised that routine Cardiac CT appointments could be scheduled as many as 8 weeks after your provider has ordered it.  For non-scheduling related questions, please contact the cardiac imaging nurse navigator should you have any questions/concerns: Marchia Bond, Cardiac Imaging Nurse Navigator Burley Saver, Interim Cardiac Imaging Nurse [place] [facility] Direct Office Dial: [phone]   For  scheduling needs, including cancellations and rescheduling, please call Vivien Rota at [phone], option 3.        Signed, Donato Heinz, MD  03/12/2020 10:13 PM    [facility]

## 2020-03-12 ENCOUNTER — Other Ambulatory Visit: Payer: Self-pay

## 2020-03-12 ENCOUNTER — Ambulatory Visit: Payer: 59 | Admitting: Cardiology

## 2020-03-12 ENCOUNTER — Other Ambulatory Visit: Payer: Self-pay | Admitting: Cardiology

## 2020-03-12 ENCOUNTER — Ambulatory Visit (INDEPENDENT_AMBULATORY_CARE_PROVIDER_SITE_OTHER): Payer: 59 | Admitting: Cardiology

## 2020-03-12 VITALS — BP 130/72 | HR 75 | Ht 71.0 in | Wt 204.0 lb

## 2020-03-12 DIAGNOSIS — E785 Hyperlipidemia, unspecified: Secondary | ICD-10-CM | POA: Diagnosis not present

## 2020-03-12 DIAGNOSIS — R079 Chest pain, unspecified: Secondary | ICD-10-CM | POA: Diagnosis not present

## 2020-03-12 DIAGNOSIS — I10 Essential (primary) hypertension: Secondary | ICD-10-CM

## 2020-03-12 MED ORDER — METOPROLOL TARTRATE 100 MG PO TABS: ORAL_TABLET | ORAL | 0 refills | Status: DC

## 2020-03-12 MED FILL — METOPROLOL TARTRATE 100 MG: 100 | 1 days supply | Qty: 1 | Fill #0

## 2020-03-12 NOTE — Patient Instructions (Signed)
Medication Instructions:  Your physician recommends that you continue on your current medications as directed. Please refer to the Current Medication list given to you today.  Testing/Procedures: Your physician has requested that you have an echocardiogram. Echocardiography is a painless test that uses sound waves to create images of your heart. It provides your doctor with information about the size and shape of your heart and how well your heart's chambers and valves are working. This procedure takes approximately one hour. There are no restrictions for this procedure.  This will be done at our [facility] location:  [address]  Coronary CTA-see instruction sheet  Follow-Up: At [facility], you and your health needs are our priority.  As part of our continuing mission to provide you with exceptional heart care, we have created designated Provider Care Teams.  These Care Teams include your primary Cardiologist (physician) and Advanced Practice Providers (APPs -  Physician Assistants and Nurse Practitioners) who all work together to provide you with the care you need, when you need it.  We recommend signing up for the patient portal called "MyChart".  Sign up information is provided on this After Visit Summary.  MyChart is used to connect with patients for Virtual Visits (Telemedicine).  Patients are able to view lab/test results, encounter notes, upcoming appointments, etc.  Non-urgent messages can be sent to your provider as well.   To learn more about what you can do with MyChart, go to [email or web address].    Your next appointment:   3 month(s)  The format for your next appointment:   In Person  Provider:   Oswaldo Milian, MD   Other Instructions Your cardiac CT will be scheduled at one of the below locations:   [facility] [address] [place], [place] [address] [phone]  [place] [address] [place], [place] [address] [phone]  If scheduled at [facility], please arrive at the [facility] main entrance of [facility] 30 minutes prior to test start time. Proceed to the [facility] Radiology Department (first floor) to check-in and test prep.  If scheduled at [facility], please arrive 15 mins early for check-in and test prep.  Please follow these instructions carefully (unless otherwise directed):   On the Night Before the Test: . Be sure to Drink plenty of water. . Do not consume any caffeinated/decaffeinated beverages or chocolate 12 hours prior to your test. . Do not take any antihistamines 12 hours prior to your test.  On the Day of the Test: . Drink plenty of water. Do not drink any water within one hour of the test. . Do not eat any food 4 hours prior to the test. . You may take your regular medications prior to the test.  . Take metoprolol (Lopressor) two hours prior to test.     After the Test: . Drink plenty of water. . After receiving IV contrast, you may experience a mild flushed feeling. This is normal. . On occasion, you may experience a mild rash up to 24 hours after the test. This is not dangerous. If this occurs, you can take Benadryl 25 mg and increase your fluid intake. . If you experience trouble breathing, this can be serious. If it is severe call 911 IMMEDIATELY. If it is mild, please call our office. . If you take any of these medications: Glipizide/Metformin, Avandament, Glucavance, please do not take 48  hours after completing test unless otherwise instructed.   Once we have confirmed authorization from your insurance company, we will call you to set up a date and time for your test. Based on how quickly your insurance processes prior authorizations requests, please allow up to 4 weeks to be contacted for scheduling your Cardiac CT appointment. Be advised that routine Cardiac CT  appointments could be scheduled as many as 8 weeks after your provider has ordered it.  For non-scheduling related questions, please contact the cardiac imaging nurse navigator should you have any questions/concerns: Marchia Bond, Cardiac Imaging Nurse Navigator Burley Saver, Interim Cardiac Imaging Nurse [place] [facility] Direct Office Dial: [phone]   For scheduling needs, including cancellations and rescheduling, please call Vivien Rota at [phone], option 3.

## 2020-03-18 MED FILL — ALPRAZOLAM 1 MG TABS: 1 | 30 days supply | Qty: 45 | Fill #3

## 2020-03-18 MED FILL — AMLODIPINE-BENAZEPRIL 5-10: 5-10 | 30 days supply | Qty: 30 | Fill #2

## 2020-03-18 MED FILL — CYCLOBENZAPRINE HCL 10 MG T: 10 | 30 days supply | Qty: 90 | Fill #1

## 2020-03-18 MED FILL — HYDROCODON-APAP 5-325: 5-325 | 20 days supply | Qty: 60 | Fill #0

## 2020-03-29 ENCOUNTER — Other Ambulatory Visit: Payer: Self-pay

## 2020-03-29 ENCOUNTER — Ambulatory Visit (HOSPITAL_COMMUNITY): Payer: 59 | Attending: Cardiology

## 2020-03-29 DIAGNOSIS — R079 Chest pain, unspecified: Secondary | ICD-10-CM | POA: Diagnosis not present

## 2020-03-29 LAB — ECHOCARDIOGRAM COMPLETE
Area-P 1/2: 3.66 cm2
S' Lateral: 2.2 cm

## 2020-04-08 ENCOUNTER — Other Ambulatory Visit (HOSPITAL_COMMUNITY): Payer: Self-pay | Admitting: Physician Assistant

## 2020-04-08 DIAGNOSIS — Z1211 Encounter for screening for malignant neoplasm of colon: Secondary | ICD-10-CM | POA: Diagnosis not present

## 2020-04-08 DIAGNOSIS — R1013 Epigastric pain: Secondary | ICD-10-CM | POA: Diagnosis not present

## 2020-04-08 DIAGNOSIS — K219 Gastro-esophageal reflux disease without esophagitis: Secondary | ICD-10-CM | POA: Diagnosis not present

## 2020-04-08 MED FILL — SUCRALFATE 1 GM TAB: 1 | 30 days supply | Qty: 60 | Fill #0

## 2020-04-16 ENCOUNTER — Other Ambulatory Visit (HOSPITAL_COMMUNITY): Payer: Self-pay | Admitting: Neurosurgery

## 2020-04-16 DIAGNOSIS — Z981 Arthrodesis status: Secondary | ICD-10-CM | POA: Diagnosis not present

## 2020-04-16 DIAGNOSIS — F1120 Opioid dependence, uncomplicated: Secondary | ICD-10-CM | POA: Diagnosis not present

## 2020-04-19 MED FILL — AMLODIPINE-BENAZEPRIL 5-10: 5-10 | 30 days supply | Qty: 30 | Fill #3

## 2020-04-19 MED FILL — HYDROCODON-APAP 5-325: 5-325 | 30 days supply | Qty: 60 | Fill #0

## 2020-04-19 MED FILL — CYCLOBENZAPRINE HCL 10 MG T: 10 | 30 days supply | Qty: 90 | Fill #2

## 2020-04-19 MED FILL — ALBUTEROL SULFATE HFA 108 (: 108 (90 BAS | 16 days supply | Qty: 18 | Fill #3

## 2020-04-22 ENCOUNTER — Other Ambulatory Visit (HOSPITAL_COMMUNITY): Payer: Self-pay | Admitting: Internal Medicine

## 2020-04-22 MED FILL — ALPRAZOLAM 1 MG TABS: 1 | 30 days supply | Qty: 45 | Fill #0

## 2020-05-06 ENCOUNTER — Other Ambulatory Visit: Payer: Self-pay | Admitting: *Deleted

## 2020-05-06 DIAGNOSIS — Z01812 Encounter for preprocedural laboratory examination: Secondary | ICD-10-CM

## 2020-05-06 DIAGNOSIS — R079 Chest pain, unspecified: Secondary | ICD-10-CM

## 2020-05-07 ENCOUNTER — Other Ambulatory Visit: Payer: Self-pay

## 2020-05-07 DIAGNOSIS — Z01812 Encounter for preprocedural laboratory examination: Secondary | ICD-10-CM

## 2020-05-07 DIAGNOSIS — R079 Chest pain, unspecified: Secondary | ICD-10-CM

## 2020-05-07 LAB — BASIC METABOLIC PANEL
BUN/Creatinine Ratio: 12 (ref 10–24)
BUN: 15 mg/dL (ref 8–27)
CO2: 22 mmol/L (ref 20–29)
Calcium: 9.6 mg/dL (ref 8.6–10.2)
Chloride: 103 mmol/L (ref 96–106)
Creatinine, Ser: 1.25 mg/dL (ref 0.76–1.27)
GFR calc Af Amer: 72 mL/min/{1.73_m2} (ref >59)
GFR calc non Af Amer: 62 mL/min/{1.73_m2} (ref >59)
Glucose: 65 mg/dL (ref 65–99)
Potassium: 4.9 mmol/L (ref 3.5–5.2)
Sodium: 140 mmol/L (ref 134–144)

## 2020-05-13 ENCOUNTER — Telehealth (HOSPITAL_COMMUNITY): Payer: Self-pay | Admitting: Emergency Medicine

## 2020-05-13 NOTE — Telephone Encounter (Signed)
Attempted to call patient regarding upcoming cardiac CT appointment. °Left message on voicemail with name and callback number °Kenlyn Lose RN Navigator Cardiac Imaging °[facility] °[phone] Office °[phone] Cell ° °

## 2020-05-13 NOTE — Telephone Encounter (Signed)
Reaching out to patient to offer assistance regarding upcoming cardiac imaging study; pt verbalizes understanding of appt date/time, parking situation and where to check in, pre-test NPO status and medications ordered, and verified current allergies; name and call back number provided for further questions should they arise Kimani Hovis RN Navigator Cardiac Imaging [facility] [phone] office [phone] cell  100mg metoprolol tartrate 2 hr prior to scan Lenetta Piche  

## 2020-05-15 ENCOUNTER — Ambulatory Visit (HOSPITAL_COMMUNITY)
Admission: RE | Admit: 2020-05-15 | Discharge: 2020-05-15 | Disposition: A | Discharge status: Home / Self Care | Payer: 59 | Source: Ambulatory Visit | Attending: Cardiology | Admitting: Cardiology

## 2020-05-15 ENCOUNTER — Other Ambulatory Visit: Payer: Self-pay

## 2020-05-15 DIAGNOSIS — R079 Chest pain, unspecified: Secondary | ICD-10-CM | POA: Diagnosis not present

## 2020-05-15 DIAGNOSIS — I2510 Atherosclerotic heart disease of native coronary artery without angina pectoris: Secondary | ICD-10-CM | POA: Diagnosis not present

## 2020-05-15 MED ORDER — IOHEXOL 350 MG/ML SOLN
80.0000 mL | Freq: Once | INTRAVENOUS | Status: AC | PRN
  Administered 2020-05-15: 15:00:00 80 mL via INTRAVENOUS

## 2020-05-15 MED ORDER — NITROGLYCERIN 0.4 MG SL SUBL
SUBLINGUAL_TABLET | SUBLINGUAL | Status: AC
  Filled 2020-05-15: qty 2

## 2020-05-15 MED ORDER — NITROGLYCERIN 0.4 MG SL SUBL
0.8000 mg | SUBLINGUAL_TABLET | Freq: Once | SUBLINGUAL | Status: AC
  Administered 2020-05-15: 15:00:00 0.8 mg via SUBLINGUAL

## 2020-05-16 ENCOUNTER — Other Ambulatory Visit: Payer: Self-pay

## 2020-05-16 MED ORDER — LOVASTATIN 10 MG PO TABS: 10.0000 mg | ORAL_TABLET | Freq: Every day | ORAL | 6 refills | Status: DC

## 2020-05-16 MED FILL — HYDROCODON-APAP 5-325: 5-325 | 20 days supply | Qty: 60 | Fill #0

## 2020-05-16 MED FILL — BUSPIRONE HCL 30 MG TABS: 30 | 90 days supply | Qty: 180 | Fill #1

## 2020-05-16 MED FILL — PANTOPRAZOLE SOD DR 40 MG T: 40 | 90 days supply | Qty: 180 | Fill #0

## 2020-05-16 MED FILL — AMLODIPINE-BENAZEPRIL 5-10: 5-10 | 30 days supply | Qty: 30 | Fill #4

## 2020-05-16 MED FILL — CYCLOBENZAPRINE HCL 10 MG T: 10 | 30 days supply | Qty: 90 | Fill #3

## 2020-05-16 MED FILL — TAMSULOSIN HCL 0.4 MG CAP: 0.4 | 90 days supply | Qty: 180 | Fill #0

## 2020-05-16 MED FILL — LOVASTATIN 10 MG TABS: 10 | 30 days supply | Qty: 30 | Fill #0

## 2020-05-17 MED FILL — ALPRAZOLAM 1 MG TABS: 1 | 30 days supply | Qty: 45 | Fill #1

## 2020-05-30 DIAGNOSIS — Z01812 Encounter for preprocedural laboratory examination: Secondary | ICD-10-CM | POA: Diagnosis not present

## 2020-05-30 DIAGNOSIS — Z1159 Encounter for screening for other viral diseases: Secondary | ICD-10-CM | POA: Diagnosis not present

## 2020-06-05 DIAGNOSIS — R1013 Epigastric pain: Secondary | ICD-10-CM | POA: Diagnosis not present

## 2020-06-05 DIAGNOSIS — K209 Esophagitis, unspecified without bleeding: Secondary | ICD-10-CM | POA: Diagnosis not present

## 2020-06-05 DIAGNOSIS — R0789 Other chest pain: Secondary | ICD-10-CM | POA: Diagnosis not present

## 2020-06-05 DIAGNOSIS — K2930 Chronic superficial gastritis without bleeding: Secondary | ICD-10-CM | POA: Diagnosis not present

## 2020-06-05 DIAGNOSIS — Z1211 Encounter for screening for malignant neoplasm of colon: Secondary | ICD-10-CM | POA: Diagnosis not present

## 2020-06-11 ENCOUNTER — Other Ambulatory Visit (HOSPITAL_COMMUNITY): Payer: Self-pay | Admitting: Internal Medicine

## 2020-06-11 MED FILL — ATORVASTATIN CALCIUM 10 MG: 10 | 30 days supply | Qty: 30 | Fill #0

## 2020-06-18 MED FILL — ALPRAZOLAM 1 MG TABS: 1 | 30 days supply | Qty: 45 | Fill #2

## 2020-06-18 MED FILL — CYCLOBENZAPRINE HCL 10 MG T: 10 | 30 days supply | Qty: 90 | Fill #0

## 2020-06-18 MED FILL — AMLODIPINE-BENAZEPRIL 5-10: 5-10 | 30 days supply | Qty: 30 | Fill #5

## 2020-06-21 MED FILL — HYDROCODON-APAP 5-325: 5-325 | 20 days supply | Qty: 60 | Fill #0

## 2020-07-10 ENCOUNTER — Other Ambulatory Visit (HOSPITAL_COMMUNITY): Payer: Self-pay | Admitting: Neurosurgery

## 2020-07-10 DIAGNOSIS — M5030 Other cervical disc degeneration, unspecified cervical region: Secondary | ICD-10-CM | POA: Diagnosis not present

## 2020-07-10 DIAGNOSIS — F1120 Opioid dependence, uncomplicated: Secondary | ICD-10-CM | POA: Diagnosis not present

## 2020-07-10 DIAGNOSIS — Z981 Arthrodesis status: Secondary | ICD-10-CM | POA: Diagnosis not present

## 2020-07-10 DIAGNOSIS — M5412 Radiculopathy, cervical region: Secondary | ICD-10-CM | POA: Diagnosis not present

## 2020-07-19 MED FILL — ALPRAZOLAM 1 MG TABS: 1 | 30 days supply | Qty: 45 | Fill #3

## 2020-07-19 MED FILL — HYDROCODON-APAP 5-325: 5-325 | 20 days supply | Qty: 60 | Fill #0

## 2020-07-19 MED FILL — ATORVASTATIN CALCIUM 10 MG: 10 | 30 days supply | Qty: 30 | Fill #1

## 2020-07-22 ENCOUNTER — Other Ambulatory Visit (HOSPITAL_COMMUNITY): Payer: Self-pay | Admitting: Internal Medicine

## 2020-07-22 MED FILL — AMLODIPINE-BENAZEPRIL 5-10: 5-10 | 30 days supply | Qty: 30 | Fill #0

## 2020-07-24 ENCOUNTER — Other Ambulatory Visit (HOSPITAL_COMMUNITY): Payer: Self-pay | Admitting: Neurosurgery

## 2020-07-24 MED FILL — CYCLOBENZAPRINE HCL 10 MG T: 10 | 30 days supply | Qty: 90 | Fill #0

## 2020-08-13 DIAGNOSIS — N400 Enlarged prostate without lower urinary tract symptoms: Secondary | ICD-10-CM | POA: Diagnosis not present

## 2020-08-13 DIAGNOSIS — I10 Essential (primary) hypertension: Secondary | ICD-10-CM | POA: Diagnosis not present

## 2020-08-13 DIAGNOSIS — E782 Mixed hyperlipidemia: Secondary | ICD-10-CM | POA: Diagnosis not present

## 2020-08-13 DIAGNOSIS — F419 Anxiety disorder, unspecified: Secondary | ICD-10-CM | POA: Diagnosis not present

## 2020-08-13 DIAGNOSIS — K219 Gastro-esophageal reflux disease without esophagitis: Secondary | ICD-10-CM | POA: Diagnosis not present

## 2020-08-20 ENCOUNTER — Other Ambulatory Visit (HOSPITAL_COMMUNITY): Payer: Self-pay | Admitting: Internal Medicine

## 2020-08-20 MED FILL — AMLODIPINE-BENAZEPRIL 5-10: 5-10 | 30 days supply | Qty: 30 | Fill #1

## 2020-08-20 MED FILL — TAMSULOSIN HCL 0.4 MG CAP: 0.4 | 90 days supply | Qty: 180 | Fill #1

## 2020-08-20 MED FILL — ATORVASTATIN 10 MG TABLET: 10 | 30 days supply | Qty: 30 | Fill #2

## 2020-08-20 MED FILL — BUSPIRONE HCL 30 MG TABS: 30 | 90 days supply | Qty: 180 | Fill #2

## 2020-08-20 MED FILL — HYDROCODON-APAP 5-325: 5-325 | 20 days supply | Qty: 60 | Fill #0

## 2020-08-20 MED FILL — ALPRAZOLAM 1 MG TABS: 1 | 30 days supply | Qty: 45 | Fill #0

## 2020-08-20 MED FILL — PANTOPRAZOLE SOD DR 40 MG T: 40 | 90 days supply | Qty: 180 | Fill #1

## 2020-08-20 MED FILL — ALBUTEROL SULFATE HFA 108 (: 108 (90 BAS | 16 days supply | Qty: 18 | Fill #0

## 2020-08-22 ENCOUNTER — Other Ambulatory Visit (HOSPITAL_BASED_OUTPATIENT_CLINIC_OR_DEPARTMENT_OTHER): Payer: Self-pay

## 2020-08-23 DIAGNOSIS — Z23 Encounter for immunization: Secondary | ICD-10-CM | POA: Diagnosis not present

## 2020-09-17 ENCOUNTER — Other Ambulatory Visit: Payer: Self-pay | Admitting: Neurosurgery

## 2020-09-17 ENCOUNTER — Other Ambulatory Visit (HOSPITAL_COMMUNITY): Payer: Self-pay

## 2020-09-17 MED FILL — Atorvastatin Calcium Tab 10 MG (Base Equivalent): ORAL | 30 days supply | Qty: 30 | Fill #0 | Status: AC

## 2020-09-17 MED FILL — Amlodipine Besylate-Benazepril HCl Cap 5-10 MG: ORAL | 30 days supply | Qty: 30 | Fill #0 | Status: AC

## 2020-09-17 MED FILL — Albuterol Sulfate Inhal Aero 108 MCG/ACT (90MCG Base Equiv): RESPIRATORY_TRACT | 20 days supply | Qty: 18 | Fill #0 | Status: AC

## 2020-09-17 MED FILL — Alprazolam Tab 1 MG: ORAL | 30 days supply | Qty: 45 | Fill #0 | Status: AC

## 2020-09-17 MED FILL — Hydrocodone-Acetaminophen Tab 5-325 MG: ORAL | 30 days supply | Qty: 60 | Fill #0 | Status: AC

## 2020-09-19 ENCOUNTER — Other Ambulatory Visit (HOSPITAL_COMMUNITY): Payer: Self-pay

## 2020-09-19 DIAGNOSIS — Z6829 Body mass index (BMI) 29.0-29.9, adult: Secondary | ICD-10-CM | POA: Diagnosis not present

## 2020-09-19 DIAGNOSIS — F1120 Opioid dependence, uncomplicated: Secondary | ICD-10-CM | POA: Diagnosis not present

## 2020-09-19 DIAGNOSIS — Z981 Arthrodesis status: Secondary | ICD-10-CM | POA: Diagnosis not present

## 2020-09-19 DIAGNOSIS — M4722 Other spondylosis with radiculopathy, cervical region: Secondary | ICD-10-CM | POA: Diagnosis not present

## 2020-09-19 DIAGNOSIS — I10 Essential (primary) hypertension: Secondary | ICD-10-CM | POA: Diagnosis not present

## 2020-09-19 MED ORDER — HYDROCODONE-ACETAMINOPHEN 5-325 MG PO TABS
ORAL_TABLET | ORAL | 0 refills | Status: DC
  Filled 2020-11-15: qty 60, 20d supply, fill #0

## 2020-09-19 MED ORDER — HYDROCODONE-ACETAMINOPHEN 5-325 MG PO TABS
ORAL_TABLET | ORAL | 0 refills | Status: DC
  Filled 2020-10-15: qty 60, 30d supply, fill #0

## 2020-09-19 MED ORDER — CYCLOBENZAPRINE HCL 10 MG PO TABS
1.0000 | ORAL_TABLET | Freq: Three times a day (TID) | ORAL | 2 refills | Status: DC | PRN
  Filled 2020-09-19: qty 90, 30d supply, fill #0
  Filled 2020-10-15: qty 90, 30d supply, fill #1
  Filled 2020-11-14: qty 90, 30d supply, fill #2

## 2020-09-19 MED ORDER — HYDROCODONE-ACETAMINOPHEN 5-325 MG PO TABS
ORAL_TABLET | ORAL | 0 refills | Status: DC
  Filled 2020-12-16: qty 60, 20d supply, fill #0

## 2020-09-20 ENCOUNTER — Other Ambulatory Visit (HOSPITAL_COMMUNITY): Payer: Self-pay

## 2020-09-23 ENCOUNTER — Other Ambulatory Visit (HOSPITAL_COMMUNITY): Payer: Self-pay

## 2020-09-24 ENCOUNTER — Other Ambulatory Visit (HOSPITAL_COMMUNITY): Payer: Self-pay

## 2020-09-24 DIAGNOSIS — K219 Gastro-esophageal reflux disease without esophagitis: Secondary | ICD-10-CM | POA: Diagnosis not present

## 2020-09-24 MED ORDER — OMEPRAZOLE 40 MG PO CPDR
DELAYED_RELEASE_CAPSULE | ORAL | 2 refills | Status: DC
  Filled 2020-09-24: qty 60, 30d supply, fill #0
  Filled 2020-10-29: qty 60, 30d supply, fill #1
  Filled 2020-11-30: qty 60, 30d supply, fill #2

## 2020-10-15 ENCOUNTER — Other Ambulatory Visit (HOSPITAL_COMMUNITY): Payer: Self-pay

## 2020-10-15 ENCOUNTER — Other Ambulatory Visit: Payer: Self-pay | Admitting: Internal Medicine

## 2020-10-15 MED FILL — Albuterol Sulfate Inhal Aero 108 MCG/ACT (90MCG Base Equiv): RESPIRATORY_TRACT | 20 days supply | Qty: 18 | Fill #1 | Status: AC

## 2020-10-15 MED FILL — Atorvastatin Calcium Tab 10 MG (Base Equivalent): ORAL | 30 days supply | Qty: 30 | Fill #1 | Status: AC

## 2020-10-15 MED FILL — Alprazolam Tab 1 MG: ORAL | 30 days supply | Qty: 45 | Fill #1 | Status: AC

## 2020-10-15 MED FILL — Amlodipine Besylate-Benazepril HCl Cap 5-10 MG: ORAL | 30 days supply | Qty: 30 | Fill #1 | Status: AC

## 2020-10-17 ENCOUNTER — Other Ambulatory Visit (HOSPITAL_COMMUNITY): Payer: Self-pay

## 2020-10-18 ENCOUNTER — Other Ambulatory Visit (HOSPITAL_COMMUNITY): Payer: Self-pay

## 2020-10-18 MED ORDER — TAMSULOSIN HCL 0.4 MG PO CAPS
0.8000 mg | ORAL_CAPSULE | Freq: Every day | ORAL | 3 refills | Status: DC
  Filled 2020-10-18 – 2020-10-29 (×2): qty 180, 90d supply, fill #0
  Filled 2021-02-13: qty 180, 90d supply, fill #1
  Filled 2021-05-14: qty 180, 90d supply, fill #2

## 2020-10-19 ENCOUNTER — Other Ambulatory Visit (HOSPITAL_COMMUNITY): Payer: Self-pay

## 2020-10-25 DIAGNOSIS — J019 Acute sinusitis, unspecified: Secondary | ICD-10-CM | POA: Diagnosis not present

## 2020-10-29 ENCOUNTER — Other Ambulatory Visit (HOSPITAL_COMMUNITY): Payer: Self-pay

## 2020-10-29 MED ORDER — CARESTART COVID-19 HOME TEST VI KIT
PACK | 0 refills | Status: DC
  Filled 2020-10-29: qty 4, 4d supply, fill #0

## 2020-10-31 ENCOUNTER — Other Ambulatory Visit (HOSPITAL_COMMUNITY): Payer: Self-pay

## 2020-11-06 ENCOUNTER — Other Ambulatory Visit (HOSPITAL_BASED_OUTPATIENT_CLINIC_OR_DEPARTMENT_OTHER): Payer: Self-pay

## 2020-11-14 ENCOUNTER — Other Ambulatory Visit (HOSPITAL_COMMUNITY): Payer: Self-pay

## 2020-11-14 MED FILL — Atorvastatin Calcium Tab 10 MG (Base Equivalent): ORAL | 30 days supply | Qty: 30 | Fill #2 | Status: AC

## 2020-11-14 MED FILL — Buspirone HCl Tab 30 MG: ORAL | 90 days supply | Qty: 180 | Fill #0 | Status: AC

## 2020-11-14 MED FILL — Amlodipine Besylate-Benazepril HCl Cap 5-10 MG: ORAL | 30 days supply | Qty: 30 | Fill #2 | Status: AC

## 2020-11-14 MED FILL — Alprazolam Tab 1 MG: ORAL | 30 days supply | Qty: 45 | Fill #2 | Status: AC

## 2020-11-15 ENCOUNTER — Other Ambulatory Visit (HOSPITAL_COMMUNITY): Payer: Self-pay

## 2020-11-30 ENCOUNTER — Other Ambulatory Visit (HOSPITAL_COMMUNITY): Payer: Self-pay

## 2020-12-11 ENCOUNTER — Other Ambulatory Visit (HOSPITAL_COMMUNITY): Payer: Self-pay

## 2020-12-11 MED ORDER — OMEPRAZOLE 40 MG PO CPDR
DELAYED_RELEASE_CAPSULE | ORAL | 1 refills | Status: DC
  Filled 2020-12-11 – 2020-12-27 (×2): qty 180, 90d supply, fill #0
  Filled 2021-03-17: qty 180, 90d supply, fill #1

## 2020-12-12 ENCOUNTER — Other Ambulatory Visit (HOSPITAL_COMMUNITY): Payer: Self-pay

## 2020-12-12 MED ORDER — CYCLOBENZAPRINE HCL 10 MG PO TABS
10.0000 mg | ORAL_TABLET | Freq: Three times a day (TID) | ORAL | 1 refills | Status: DC | PRN
  Filled 2020-12-12: qty 90, 30d supply, fill #0
  Filled 2021-01-17: qty 90, 30d supply, fill #1

## 2020-12-12 MED FILL — Atorvastatin Calcium Tab 10 MG (Base Equivalent): ORAL | 30 days supply | Qty: 30 | Fill #3 | Status: AC

## 2020-12-12 MED FILL — Amlodipine Besylate-Benazepril HCl Cap 5-10 MG: ORAL | 30 days supply | Qty: 30 | Fill #0 | Status: AC

## 2020-12-13 ENCOUNTER — Other Ambulatory Visit (HOSPITAL_COMMUNITY): Payer: Self-pay

## 2020-12-13 MED ORDER — ALPRAZOLAM 1 MG PO TABS
ORAL_TABLET | ORAL | 3 refills | Status: DC
  Filled 2020-12-13: qty 45, 30d supply, fill #0
  Filled 2021-01-17: qty 45, 30d supply, fill #1
  Filled 2021-02-13 (×2): qty 45, 30d supply, fill #2
  Filled 2021-05-14: qty 45, 30d supply, fill #3

## 2020-12-16 ENCOUNTER — Other Ambulatory Visit (HOSPITAL_COMMUNITY): Payer: Self-pay

## 2020-12-16 DIAGNOSIS — M542 Cervicalgia: Secondary | ICD-10-CM | POA: Diagnosis not present

## 2020-12-16 DIAGNOSIS — Z981 Arthrodesis status: Secondary | ICD-10-CM | POA: Diagnosis not present

## 2020-12-16 DIAGNOSIS — M5030 Other cervical disc degeneration, unspecified cervical region: Secondary | ICD-10-CM | POA: Diagnosis not present

## 2020-12-16 DIAGNOSIS — M5412 Radiculopathy, cervical region: Secondary | ICD-10-CM | POA: Diagnosis not present

## 2020-12-16 DIAGNOSIS — M4722 Other spondylosis with radiculopathy, cervical region: Secondary | ICD-10-CM | POA: Diagnosis not present

## 2020-12-16 MED ORDER — HYDROCODONE-ACETAMINOPHEN 5-325 MG PO TABS
ORAL_TABLET | ORAL | 0 refills | Status: DC
  Filled 2021-02-13: qty 60, 20d supply, fill #0
  Filled 2021-02-13: qty 60, 30d supply, fill #0

## 2020-12-16 MED ORDER — HYDROCODONE-ACETAMINOPHEN 5-325 MG PO TABS
ORAL_TABLET | ORAL | 0 refills | Status: DC
  Filled 2021-03-17: qty 60, 30d supply, fill #0

## 2020-12-16 MED ORDER — HYDROCODONE-ACETAMINOPHEN 5-325 MG PO TABS
ORAL_TABLET | ORAL | 0 refills | Status: DC
  Filled 2021-01-27: qty 60, 30d supply, fill #0

## 2020-12-18 ENCOUNTER — Telehealth: Payer: 59 | Admitting: Physician Assistant

## 2020-12-18 DIAGNOSIS — R300 Dysuria: Secondary | ICD-10-CM

## 2020-12-18 NOTE — Progress Notes (Signed)
E-Visit for Urinary Problems ? ?Based on what you shared with me, I feel your condition warrants further evaluation and I recommend that you be seen for a face to face office visit.  Male bladder infections are not very common.  We worry about prostate or kidney conditions.  The standard of care is to examine the abdomen and kidneys, and to do a urine and blood test to make sure that something more serious is not going on.  We recommend that you see a provider today.  If your doctor's office is closed [facility] has the following Urgent Cares: ? ?  ?NOTE: You will not be charged for this e-visit. ? ?If you are having a true medical emergency please call 911.   ? ?  ? For an urgent face to face visit, [facility] has six urgent care centers for your convenience:  ?  ? [facility] [place] ?Get Driving Directions ?[phone] ?[address] ?[place], [place] [address] ?  ? [facility] ([place]) ?Get Driving Directions ?[phone] ?[address] ?[place], [place] [address] ? ?[facility] ?Get Driving Directions ?[phone] ?[address] ?[place],  [place]  [address] ? ?[facility] ?Get Driving Directions ?[phone] ?[address] ?, [place] [address] ?  ?[facility] [place] ?Get Driving Directions  ?[phone] ?[address] ?[address] ?[place], [place] [address] ?  ? [facility] [place] ?Get Driving Directions ?[phone] ?[address] ?[place],  [address] ? ?Your MyChart E-visit questionnaire answers were reviewed by a board certified advanced clinical practitioner to complete your personal care plan based on your specific symptoms.  Thank you for using e-Visits. ?

## 2020-12-27 ENCOUNTER — Other Ambulatory Visit (HOSPITAL_COMMUNITY): Payer: Self-pay

## 2021-01-17 MED FILL — Amlodipine Besylate-Benazepril HCl Cap 5-10 MG: ORAL | 30 days supply | Qty: 30 | Fill #1 | Status: AC

## 2021-01-18 ENCOUNTER — Other Ambulatory Visit (HOSPITAL_COMMUNITY): Payer: Self-pay

## 2021-01-27 ENCOUNTER — Other Ambulatory Visit (HOSPITAL_COMMUNITY): Payer: Self-pay

## 2021-02-13 ENCOUNTER — Other Ambulatory Visit: Payer: Self-pay

## 2021-02-13 ENCOUNTER — Other Ambulatory Visit (HOSPITAL_COMMUNITY): Payer: Self-pay

## 2021-02-13 MED ORDER — CYCLOBENZAPRINE HCL 10 MG PO TABS
10.0000 mg | ORAL_TABLET | Freq: Three times a day (TID) | ORAL | 0 refills | Status: DC | PRN
  Filled 2021-02-13: qty 90, 30d supply, fill #0

## 2021-02-13 MED ORDER — ATORVASTATIN CALCIUM 10 MG PO TABS
ORAL_TABLET | ORAL | 0 refills | Status: DC
  Filled 2021-02-13: qty 30, 30d supply, fill #0

## 2021-02-13 MED FILL — Amlodipine Besylate-Benazepril HCl Cap 5-10 MG: ORAL | 30 days supply | Qty: 30 | Fill #2 | Status: AC

## 2021-02-17 ENCOUNTER — Other Ambulatory Visit (HOSPITAL_COMMUNITY): Payer: Self-pay

## 2021-02-17 DIAGNOSIS — G8929 Other chronic pain: Secondary | ICD-10-CM | POA: Diagnosis not present

## 2021-02-17 DIAGNOSIS — F419 Anxiety disorder, unspecified: Secondary | ICD-10-CM | POA: Diagnosis not present

## 2021-02-17 DIAGNOSIS — Z23 Encounter for immunization: Secondary | ICD-10-CM | POA: Diagnosis not present

## 2021-02-17 DIAGNOSIS — I10 Essential (primary) hypertension: Secondary | ICD-10-CM | POA: Diagnosis not present

## 2021-02-17 DIAGNOSIS — N400 Enlarged prostate without lower urinary tract symptoms: Secondary | ICD-10-CM | POA: Diagnosis not present

## 2021-02-17 DIAGNOSIS — K219 Gastro-esophageal reflux disease without esophagitis: Secondary | ICD-10-CM | POA: Diagnosis not present

## 2021-02-17 DIAGNOSIS — E782 Mixed hyperlipidemia: Secondary | ICD-10-CM | POA: Diagnosis not present

## 2021-02-17 DIAGNOSIS — Z0000 Encounter for general adult medical examination without abnormal findings: Secondary | ICD-10-CM | POA: Diagnosis not present

## 2021-02-17 DIAGNOSIS — Z1389 Encounter for screening for other disorder: Secondary | ICD-10-CM | POA: Diagnosis not present

## 2021-02-17 MED ORDER — TAMSULOSIN HCL 0.4 MG PO CAPS
0.8000 mg | ORAL_CAPSULE | Freq: Every evening | ORAL | 4 refills | Status: DC
  Filled 2021-02-17 – 2021-08-14 (×2): qty 180, 90d supply, fill #0
  Filled 2021-11-15: qty 180, 90d supply, fill #1
  Filled 2022-02-16: qty 180, 90d supply, fill #2

## 2021-02-17 MED ORDER — ALPRAZOLAM 1 MG PO TABS
ORAL_TABLET | ORAL | 3 refills | Status: DC
  Filled 2021-02-17 – 2021-03-17 (×2): qty 45, 30d supply, fill #0
  Filled 2021-04-15 (×2): qty 45, 30d supply, fill #1

## 2021-02-17 MED ORDER — ATORVASTATIN CALCIUM 10 MG PO TABS
10.0000 mg | ORAL_TABLET | Freq: Every day | ORAL | 4 refills | Status: DC
  Filled 2021-02-17 – 2021-03-17 (×2): qty 90, 90d supply, fill #0
  Filled 2021-06-04: qty 90, 90d supply, fill #1
  Filled 2021-08-28: qty 90, 90d supply, fill #2
  Filled 2021-11-15: qty 90, 90d supply, fill #3

## 2021-02-17 MED ORDER — BUSPIRONE HCL 30 MG PO TABS
ORAL_TABLET | ORAL | 4 refills | Status: DC
  Filled 2021-02-17: qty 180, 90d supply, fill #0
  Filled 2021-05-14: qty 180, 90d supply, fill #1
  Filled 2021-08-14: qty 180, 90d supply, fill #2
  Filled 2021-11-15: qty 180, 90d supply, fill #3
  Filled 2022-02-16: qty 180, 90d supply, fill #4

## 2021-02-17 MED ORDER — AMLODIPINE BESY-BENAZEPRIL HCL 5-10 MG PO CAPS
ORAL_CAPSULE | ORAL | 4 refills | Status: DC
  Filled 2021-02-17 – 2021-03-17 (×2): qty 90, 90d supply, fill #0
  Filled 2021-06-04: qty 90, 90d supply, fill #1
  Filled 2021-08-28: qty 90, 90d supply, fill #2
  Filled 2021-12-16: qty 90, 90d supply, fill #3

## 2021-02-18 ENCOUNTER — Other Ambulatory Visit (HOSPITAL_COMMUNITY): Payer: Self-pay

## 2021-03-11 ENCOUNTER — Other Ambulatory Visit (HOSPITAL_COMMUNITY): Payer: Self-pay

## 2021-03-11 DIAGNOSIS — M5030 Other cervical disc degeneration, unspecified cervical region: Secondary | ICD-10-CM | POA: Diagnosis not present

## 2021-03-11 DIAGNOSIS — Z981 Arthrodesis status: Secondary | ICD-10-CM | POA: Diagnosis not present

## 2021-03-11 DIAGNOSIS — F1120 Opioid dependence, uncomplicated: Secondary | ICD-10-CM | POA: Diagnosis not present

## 2021-03-11 MED ORDER — HYDROCODONE-ACETAMINOPHEN 5-325 MG PO TABS: ORAL_TABLET | ORAL | 0 refills | Status: DC

## 2021-03-11 MED ORDER — HYDROCODONE-ACETAMINOPHEN 5-325 MG PO TABS
ORAL_TABLET | ORAL | 0 refills | Status: DC
  Filled 2021-04-15: qty 60, 30d supply, fill #0

## 2021-03-11 MED ORDER — HYDROCODONE-ACETAMINOPHEN 5-325 MG PO TABS
ORAL_TABLET | ORAL | 0 refills | Status: DC
  Filled 2021-05-15: qty 60, 20d supply, fill #0

## 2021-03-17 ENCOUNTER — Other Ambulatory Visit (HOSPITAL_COMMUNITY): Payer: Self-pay

## 2021-03-17 MED ORDER — CYCLOBENZAPRINE HCL 10 MG PO TABS
10.0000 mg | ORAL_TABLET | Freq: Three times a day (TID) | ORAL | 1 refills | Status: DC | PRN
  Filled 2021-03-17: qty 90, 30d supply, fill #0
  Filled 2021-04-15: qty 90, 30d supply, fill #1

## 2021-04-15 ENCOUNTER — Other Ambulatory Visit (HOSPITAL_COMMUNITY): Payer: Self-pay

## 2021-04-15 MED FILL — Amlodipine Besylate-Benazepril HCl Cap 5-10 MG: ORAL | 30 days supply | Qty: 30 | Fill #3 | Status: CN

## 2021-05-06 DIAGNOSIS — H90A31 Mixed conductive and sensorineural hearing loss, unilateral, right ear with restricted hearing on the contralateral side: Secondary | ICD-10-CM | POA: Diagnosis not present

## 2021-05-06 DIAGNOSIS — H90A22 Sensorineural hearing loss, unilateral, left ear, with restricted hearing on the contralateral side: Secondary | ICD-10-CM | POA: Diagnosis not present

## 2021-05-14 ENCOUNTER — Other Ambulatory Visit (HOSPITAL_COMMUNITY): Payer: Self-pay

## 2021-05-14 MED ORDER — CYCLOBENZAPRINE HCL 10 MG PO TABS
10.0000 mg | ORAL_TABLET | Freq: Three times a day (TID) | ORAL | 0 refills | Status: DC | PRN
  Filled 2021-05-14: qty 90, 30d supply, fill #0

## 2021-05-15 ENCOUNTER — Other Ambulatory Visit (HOSPITAL_COMMUNITY): Payer: Self-pay

## 2021-05-15 DIAGNOSIS — H903 Sensorineural hearing loss, bilateral: Secondary | ICD-10-CM | POA: Diagnosis not present

## 2021-05-20 DIAGNOSIS — R972 Elevated prostate specific antigen [PSA]: Secondary | ICD-10-CM | POA: Diagnosis not present

## 2021-06-04 ENCOUNTER — Other Ambulatory Visit (HOSPITAL_COMMUNITY): Payer: Self-pay

## 2021-06-05 ENCOUNTER — Other Ambulatory Visit (HOSPITAL_COMMUNITY): Payer: Self-pay

## 2021-06-05 MED ORDER — OMEPRAZOLE 40 MG PO CPDR
DELAYED_RELEASE_CAPSULE | ORAL | 1 refills | Status: DC
  Filled 2021-06-05: qty 180, 90d supply, fill #0
  Filled 2021-09-16: qty 180, 90d supply, fill #1

## 2021-06-11 ENCOUNTER — Other Ambulatory Visit (HOSPITAL_COMMUNITY): Payer: Self-pay

## 2021-06-11 DIAGNOSIS — M5412 Radiculopathy, cervical region: Secondary | ICD-10-CM | POA: Diagnosis not present

## 2021-06-11 DIAGNOSIS — F1120 Opioid dependence, uncomplicated: Secondary | ICD-10-CM | POA: Diagnosis not present

## 2021-06-11 DIAGNOSIS — Z981 Arthrodesis status: Secondary | ICD-10-CM | POA: Diagnosis not present

## 2021-06-11 MED ORDER — HYDROCODONE-ACETAMINOPHEN 5-325 MG PO TABS
ORAL_TABLET | ORAL | 0 refills | Status: DC
  Filled 2021-06-18: qty 60, 20d supply, fill #0

## 2021-06-11 MED ORDER — HYDROCODONE-ACETAMINOPHEN 5-325 MG PO TABS
ORAL_TABLET | ORAL | 0 refills | Status: DC
  Filled 2021-07-18: qty 60, 20d supply, fill #0

## 2021-06-11 MED ORDER — HYDROCODONE-ACETAMINOPHEN 5-325 MG PO TABS
ORAL_TABLET | ORAL | 0 refills | Status: DC
  Filled 2021-08-14: qty 60, 20d supply, fill #0

## 2021-06-11 MED ORDER — CYCLOBENZAPRINE HCL 10 MG PO TABS
10.0000 mg | ORAL_TABLET | Freq: Three times a day (TID) | ORAL | 2 refills | Status: DC | PRN
  Filled 2021-06-11: qty 90, 30d supply, fill #0
  Filled 2021-07-18: qty 90, 30d supply, fill #1
  Filled 2021-08-14: qty 90, 30d supply, fill #2

## 2021-06-16 ENCOUNTER — Other Ambulatory Visit (HOSPITAL_COMMUNITY): Payer: Self-pay

## 2021-06-17 ENCOUNTER — Other Ambulatory Visit (HOSPITAL_COMMUNITY): Payer: Self-pay

## 2021-06-17 MED ORDER — ALPRAZOLAM 1 MG PO TABS
ORAL_TABLET | ORAL | 3 refills | Status: DC
  Filled 2021-06-17: qty 45, 30d supply, fill #0
  Filled 2021-07-18: qty 45, 30d supply, fill #1
  Filled 2021-08-14: qty 45, 30d supply, fill #2
  Filled 2021-09-16: qty 45, 30d supply, fill #3

## 2021-06-18 ENCOUNTER — Other Ambulatory Visit (HOSPITAL_COMMUNITY): Payer: Self-pay

## 2021-06-25 ENCOUNTER — Other Ambulatory Visit (HOSPITAL_COMMUNITY): Payer: Self-pay

## 2021-07-15 ENCOUNTER — Other Ambulatory Visit (HOSPITAL_COMMUNITY): Payer: Self-pay

## 2021-07-15 DIAGNOSIS — Z87891 Personal history of nicotine dependence: Secondary | ICD-10-CM | POA: Diagnosis not present

## 2021-07-15 DIAGNOSIS — H6123 Impacted cerumen, bilateral: Secondary | ICD-10-CM | POA: Diagnosis not present

## 2021-07-15 DIAGNOSIS — H903 Sensorineural hearing loss, bilateral: Secondary | ICD-10-CM | POA: Diagnosis not present

## 2021-07-15 DIAGNOSIS — H938X3 Other specified disorders of ear, bilateral: Secondary | ICD-10-CM | POA: Diagnosis not present

## 2021-07-15 DIAGNOSIS — L299 Pruritus, unspecified: Secondary | ICD-10-CM | POA: Diagnosis not present

## 2021-07-15 DIAGNOSIS — Z974 Presence of external hearing-aid: Secondary | ICD-10-CM | POA: Diagnosis not present

## 2021-07-15 DIAGNOSIS — H6983 Other specified disorders of Eustachian tube, bilateral: Secondary | ICD-10-CM | POA: Diagnosis not present

## 2021-07-15 MED ORDER — FLUTICASONE PROPIONATE 50 MCG/ACT NA SUSP
2.0000 | Freq: Every day | NASAL | 11 refills | Status: DC
  Filled 2021-08-28: qty 16, 30d supply, fill #0
  Filled 2021-10-01: qty 16, 30d supply, fill #1
  Filled 2021-10-31 – 2021-11-15 (×2): qty 16, 30d supply, fill #2
  Filled 2021-12-16: qty 16, 30d supply, fill #3
  Filled 2022-01-15: qty 16, 30d supply, fill #4
  Filled 2022-02-16: qty 16, 30d supply, fill #5
  Filled 2022-03-16: qty 16, 30d supply, fill #6
  Filled 2022-04-13 (×2): qty 16, 30d supply, fill #7
  Filled 2022-05-14: qty 16, 30d supply, fill #8
  Filled 2022-06-16: qty 16, 30d supply, fill #9

## 2021-07-18 ENCOUNTER — Other Ambulatory Visit (HOSPITAL_COMMUNITY): Payer: Self-pay

## 2021-08-14 ENCOUNTER — Other Ambulatory Visit (HOSPITAL_COMMUNITY): Payer: Self-pay

## 2021-08-21 ENCOUNTER — Other Ambulatory Visit (HOSPITAL_COMMUNITY): Payer: Self-pay

## 2021-08-21 DIAGNOSIS — F419 Anxiety disorder, unspecified: Secondary | ICD-10-CM | POA: Diagnosis not present

## 2021-08-21 DIAGNOSIS — K219 Gastro-esophageal reflux disease without esophagitis: Secondary | ICD-10-CM | POA: Diagnosis not present

## 2021-08-21 DIAGNOSIS — N400 Enlarged prostate without lower urinary tract symptoms: Secondary | ICD-10-CM | POA: Diagnosis not present

## 2021-08-21 DIAGNOSIS — G8929 Other chronic pain: Secondary | ICD-10-CM | POA: Diagnosis not present

## 2021-08-21 DIAGNOSIS — I10 Essential (primary) hypertension: Secondary | ICD-10-CM | POA: Diagnosis not present

## 2021-08-21 DIAGNOSIS — R21 Rash and other nonspecific skin eruption: Secondary | ICD-10-CM | POA: Diagnosis not present

## 2021-08-21 DIAGNOSIS — E782 Mixed hyperlipidemia: Secondary | ICD-10-CM | POA: Diagnosis not present

## 2021-08-21 MED ORDER — TRIAMCINOLONE ACETONIDE 0.1 % EX OINT
TOPICAL_OINTMENT | CUTANEOUS | 1 refills | Status: DC
  Filled 2021-08-21: qty 30, 14d supply, fill #0
  Filled 2021-09-16: qty 30, 14d supply, fill #1

## 2021-08-27 ENCOUNTER — Other Ambulatory Visit (HOSPITAL_COMMUNITY): Payer: Self-pay

## 2021-08-27 DIAGNOSIS — I10 Essential (primary) hypertension: Secondary | ICD-10-CM | POA: Diagnosis not present

## 2021-08-27 DIAGNOSIS — M5412 Radiculopathy, cervical region: Secondary | ICD-10-CM | POA: Diagnosis not present

## 2021-08-27 DIAGNOSIS — F1120 Opioid dependence, uncomplicated: Secondary | ICD-10-CM | POA: Diagnosis not present

## 2021-08-27 DIAGNOSIS — Z6828 Body mass index (BMI) 28.0-28.9, adult: Secondary | ICD-10-CM | POA: Diagnosis not present

## 2021-08-27 DIAGNOSIS — M5030 Other cervical disc degeneration, unspecified cervical region: Secondary | ICD-10-CM | POA: Diagnosis not present

## 2021-08-27 MED ORDER — CYCLOBENZAPRINE HCL 10 MG PO TABS
10.0000 mg | ORAL_TABLET | Freq: Three times a day (TID) | ORAL | 2 refills | Status: DC | PRN
  Filled 2021-08-27 – 2021-09-16 (×2): qty 90, 30d supply, fill #0
  Filled 2021-10-15: qty 90, 30d supply, fill #1
  Filled 2021-11-15: qty 90, 30d supply, fill #2

## 2021-08-27 MED ORDER — HYDROCODONE-ACETAMINOPHEN 5-325 MG PO TABS
ORAL_TABLET | ORAL | 0 refills | Status: DC
  Filled 2021-10-15: qty 60, 20d supply, fill #0

## 2021-08-27 MED ORDER — HYDROCODONE-ACETAMINOPHEN 5-325 MG PO TABS
ORAL_TABLET | ORAL | 0 refills | Status: DC
  Filled 2021-09-16: qty 60, 20d supply, fill #0

## 2021-08-27 MED ORDER — HYDROCODONE-ACETAMINOPHEN 5-325 MG PO TABS
ORAL_TABLET | ORAL | 0 refills | Status: DC
  Filled 2021-11-15: qty 60, 20d supply, fill #0

## 2021-08-28 ENCOUNTER — Other Ambulatory Visit (HOSPITAL_COMMUNITY): Payer: Self-pay

## 2021-09-16 ENCOUNTER — Other Ambulatory Visit (HOSPITAL_COMMUNITY): Payer: Self-pay

## 2021-09-29 ENCOUNTER — Other Ambulatory Visit (HOSPITAL_COMMUNITY): Payer: Self-pay

## 2021-09-29 MED ORDER — OMEPRAZOLE 40 MG PO CPDR
DELAYED_RELEASE_CAPSULE | ORAL | 2 refills | Status: DC
  Filled 2021-09-29 – 2021-12-16 (×2): qty 180, 90d supply, fill #0
  Filled 2022-04-13 – 2022-05-14 (×3): qty 180, 90d supply, fill #1
  Filled 2022-08-13: qty 180, 90d supply, fill #2

## 2021-10-01 ENCOUNTER — Other Ambulatory Visit (HOSPITAL_COMMUNITY): Payer: Self-pay

## 2021-10-15 ENCOUNTER — Other Ambulatory Visit (HOSPITAL_COMMUNITY): Payer: Self-pay

## 2021-10-15 MED ORDER — TRIAMCINOLONE ACETONIDE 0.1 % EX OINT
TOPICAL_OINTMENT | CUTANEOUS | 1 refills | Status: DC
  Filled 2021-10-15: qty 30, 14d supply, fill #0
  Filled 2021-11-15: qty 30, 14d supply, fill #1

## 2021-10-16 ENCOUNTER — Other Ambulatory Visit: Payer: Self-pay

## 2021-10-16 ENCOUNTER — Emergency Department (HOSPITAL_COMMUNITY)
Admission: EM | Admit: 2021-10-16 | Discharge: 2021-10-16 | Disposition: A | Discharge status: Home / Self Care | Payer: 59 | Attending: Student | Admitting: Student

## 2021-10-16 ENCOUNTER — Encounter (HOSPITAL_COMMUNITY): Payer: Self-pay

## 2021-10-16 ENCOUNTER — Other Ambulatory Visit (HOSPITAL_COMMUNITY): Payer: Self-pay

## 2021-10-16 DIAGNOSIS — S61411A Laceration without foreign body of right hand, initial encounter: Secondary | ICD-10-CM | POA: Insufficient documentation

## 2021-10-16 DIAGNOSIS — W268XXA Contact with other sharp object(s), not elsewhere classified, initial encounter: Secondary | ICD-10-CM | POA: Insufficient documentation

## 2021-10-16 DIAGNOSIS — Z7982 Long term (current) use of aspirin: Secondary | ICD-10-CM | POA: Insufficient documentation

## 2021-10-16 DIAGNOSIS — Z79899 Other long term (current) drug therapy: Secondary | ICD-10-CM | POA: Diagnosis not present

## 2021-10-16 DIAGNOSIS — S6991XA Unspecified injury of right wrist, hand and finger(s), initial encounter: Secondary | ICD-10-CM | POA: Diagnosis present

## 2021-10-16 MED ORDER — LIDOCAINE HCL (PF) 1 % IJ SOLN
5.0000 mL | Freq: Once | INTRAMUSCULAR | Status: AC
  Administered 2021-10-16: 5 mL via INTRADERMAL
  Filled 2021-10-16: qty 5

## 2021-10-16 MED ORDER — POVIDONE-IODINE 5 % EX SOLN: Freq: Once | CUTANEOUS | Status: DC

## 2021-10-16 MED ORDER — ALPRAZOLAM 1 MG PO TABS
ORAL_TABLET | ORAL | 3 refills | Status: DC
  Filled 2021-10-16: qty 45, 30d supply, fill #0
  Filled 2021-11-15: qty 45, 30d supply, fill #1
  Filled 2021-12-16: qty 45, 30d supply, fill #2
  Filled 2022-01-15: qty 45, 30d supply, fill #3

## 2021-10-16 MED ORDER — POVIDONE-IODINE 10 % EX SOLN
CUTANEOUS | Status: AC
  Filled 2021-10-16: qty 14.8

## 2021-10-16 NOTE — Discharge Instructions (Signed)
Suture removal in 8 days  °

## 2021-10-16 NOTE — ED Provider Notes (Signed)
[facility] Provider Note   CSN: [id number] Arrival date & time: 10/16/21  1543     History  Chief Complaint  Patient presents with   Laceration    L Hand     [name] is a 61 y.o. male.  Patient reports he cut his left hand on a grinder.  Patient does not think that he has any type of foreign body in the wound patient reports that he was worried that he could have a blood vessel.  The history is provided by the patient. No language interpreter was used.  Laceration Location:  Hand Hand laceration location:  L hand Length:  1.5 Depth:  Cutaneous Quality: straight   Bleeding: controlled   Time since incident:  2 hours Laceration mechanism:  Metal edge Pain details:    Quality:  Aching   Severity:  Mild   Timing:  Constant   Progression:  Worsening Foreign body present:  Metal Relieved by:  Nothing Worsened by:  Nothing Tetanus status:  Unknown Associated symptoms: no numbness and no redness       Home Medications Prior to Admission medications   Medication Sig Start Date End Date Taking? Authorizing Provider  albuterol (PROVENTIL HFA;VENTOLIN HFA) 108 (90 BASE) MCG/ACT inhaler Inhale 2 puffs into the lungs every 6 (six) hours as needed for wheezing or shortness of breath.    [provider]  albuterol (VENTOLIN HFA) 108 (90 Base) MCG/ACT inhaler INHALE 2 PUFFS BY MOUTH INTO THE LUNGS EVERY 4 HOURS AS NEEDED 08/20/20 08/20/21  Wenda Low, MD  albuterol (VENTOLIN HFA) 108 (90 Base) MCG/ACT inhaler INHALE 2 PUFFS BY MOUTH EVERY 4 HOURS AS NEEDED 12/13/19 12/12/20  Wenda Low, MD  ALPRAZolam Duanne Moron) 1 MG tablet Take 1 mg by mouth as needed.    [provider]  ALPRAZolam Duanne Moron) 1 MG tablet Take 1/2 tablet by mouth three times a day as needed (30 day supply) 12/13/20     ALPRAZolam (XANAX) 1 MG tablet Take 1/2 tablet by mouth three times a day as needed 02/17/21     ALPRAZolam (XANAX) 1 MG tablet Take 1/2 tablet by mouth 3  times daily as needed. 10/16/21     amLODipine-benazepril (LOTREL) 5-10 MG capsule TAKE 1 CAPSULE BY MOUTH ONCE A DAY 08/20/20 08/20/21  Wenda Low, MD  amLODipine-benazepril (LOTREL) 5-10 MG capsule TAKE 1 CAPSULE BY MOUTH ONCE A DAY AS DIRECTED 07/22/20 07/22/21  Wenda Low, MD  amLODipine-benazepril (LOTREL) 5-10 MG capsule TAKE 1 CAPSULE BY MOUTH ONCE DAILY AS DIRECTED 01/04/20 01/03/21  Wenda Low, MD  amLODipine-benazepril (LOTREL) 5-10 MG capsule Take 1 capsule by mouth once a day 02/17/21     aspirin 81 MG EC tablet Take 81 mg by mouth daily.    [provider]  atorvastatin (LIPITOR) 10 MG tablet TAKE 1 TABLET BY MOUTH ONCE A DAY 06/11/20 06/11/21  Wenda Low, MD  atorvastatin (LIPITOR) 10 MG tablet Take 1 tablet by mouth once a day. 02/13/21     atorvastatin (LIPITOR) 10 MG tablet Take 1 tablet by mouth daily. 02/17/21     augmented betamethasone dipropionate (DIPROLENE-AF) 0.05 % cream Apply 1 application topically 2 (two) times daily. 02/22/20   [provider]  busPIRone (BUSPAR) 10 MG tablet Take 10 mg by mouth 3 (three) times daily.    [provider]  busPIRone (BUSPAR) 30 MG tablet Take 2/3 of tablet (61m) by mouth 3 times daily 02/17/21     ciprofloxacin-hydrocortisone (CIPRO HC)  OTIC suspension Place 3 drops into the right ear 2 (two) times daily. 12/19/17   Sharion Balloon, FNP  COVID-19 At [facility] Antigen Test [facility] COVID-19 HOME TEST) KIT use as directed 10/29/20   Edmon Crape, RPH  cyclobenzaprine (FLEXERIL) 10 MG tablet Take 10 mg by mouth 3 (three) times daily as needed.    [provider]  cyclobenzaprine (FLEXERIL) 10 MG tablet TAKE 1 TABLET BY MOUTH 3 TIMES DAILY AS NEEDED FOR MUSCLE SPASMS 08/27/21     fluticasone (FLONASE) 50 MCG/ACT nasal spray Place 2 sprays into both nostrils at bedtime. 07/15/21     HYDROcodone-acetaminophen (NORCO/VICODIN) 5-325 MG tablet Take 1 tablet by mouth every 8-12 hours as needed for chronic pain * Do  not fill until 11/15/20 11/15/20     HYDROcodone-acetaminophen (NORCO/VICODIN) 5-325 MG tablet Take 1 tablet by mouth every 12 hours as needed for chronic pain ** Do Not fill until 10/14/20 10/14/20     HYDROcodone-acetaminophen (NORCO/VICODIN) 5-325 MG tablet Take 1 tablet by mouth every 8-12 hours as needed for chronic pain ( DNF UNTIL 01/13/2021) 12/16/20     HYDROcodone-acetaminophen (NORCO/VICODIN) 5-325 MG tablet Take 1 tablet by mouth every 8-12 hours as needed for chronic pain (DNF UNTIL 02/13/2021) 12/16/20     HYDROcodone-acetaminophen (NORCO/VICODIN) 5-325 MG tablet Take 1 tablet by mouth every 12 hours as needed for chronic pain (DNF UNTIL 03/14/2021) 12/16/20     HYDROcodone-acetaminophen (NORCO/VICODIN) 5-325 MG tablet Take 1 tablet by mouth every 12 hours as needed for chronic pain (DNF UNTIL 03/14/2021) 03/11/21     HYDROcodone-acetaminophen (NORCO/VICODIN) 5-325 MG tablet Take one tablet by mouth every 8-12 hours as needed for chronic pain ( DNF UNTIL 05/15/2021) 03/11/21     HYDROcodone-acetaminophen (NORCO/VICODIN) 5-325 MG tablet Take one tablet by mouth every 8-12 hours as needed for chronic pain (DNF UNTIL 04/15/2021) 03/11/21     HYDROcodone-acetaminophen (NORCO/VICODIN) 5-325 MG tablet Take 1 tablet by mouth every 8 to 12 hours as needed for chronic pain  (DNF 07/14/21) 06/11/21     HYDROcodone-acetaminophen (NORCO/VICODIN) 5-325 MG tablet Take 1 tablet by mouth every 8 to 12 hours as needed for chronic pain (DNF 06/14/21) 06/11/21     HYDROcodone-acetaminophen (NORCO/VICODIN) 5-325 MG tablet Take 1 tablet by mouth every 8 to 12 hours as needed for chronic pain (DNF 08/12/21) 06/11/21     HYDROcodone-acetaminophen (NORCO/VICODIN) 5-325 MG tablet Take 1 tablet by mouth every 8-12 hours as needed for chronic pain (DNF 10/12/21) 10/12/21     HYDROcodone-acetaminophen (NORCO/VICODIN) 5-325 MG tablet Take 1 tablet by mouth every 8-12 hours as needed for chronic pain (DNF 09/13/21) 09/13/21      HYDROcodone-acetaminophen (NORCO/VICODIN) 5-325 MG tablet Take 1 tablet by mouth every 8-12 hours as needed for chronic pain (DNF 11/11/21) 11/11/21     HYDROcodone-acetaminophen (VICODIN) 5-500 MG per tablet Take 1 tablet by mouth every 6 (six) hours as needed.    [provider]  lovastatin (MEVACOR) 10 MG tablet TAKE 1 TABLET BY MOUTH AT BEDTIME 05/16/20 05/16/21  Donato Heinz, MD  metoprolol tartrate (LOPRESSOR) 100 MG tablet TAKE 1 TABLET BY MOUTH 2 HOURS PRIOR TO CT 03/12/20 03/12/21  Donato Heinz, MD  neomycin-polymyxin-hydrocortisone (CORTISPORIN) OTIC solution Place 4 drops into the right ear 4 (four) times daily. 12/20/17   Sharion Balloon, FNP  omeprazole (PRILOSEC) 40 MG capsule Take 1 capsule by mouth 2 times a day 30 minutes before a meal 09/29/21     pantoprazole (PROTONIX) 40  MG tablet Take 30-60 min before first meal of the day 05/22/13   Tanda Rockers, MD  pantoprazole (PROTONIX) 40 MG tablet TAKE 1 TABLET BY MOUTH 2 TIMES DAILY 05/16/20 05/16/21  Wenda Low, MD  sucralfate (CARAFATE) 1 g tablet TAKE 1 TABLET BY MOUTH TWICE DAILY ON AN EMPTY STOMACH (BEFORE FOOD) 04/08/20 04/08/21  Vladimir Crofts, PA-C  tamsulosin (FLOMAX) 0.4 MG CAPS capsule Take 1 capsule by mouth in the morning and at bedtime. 12/13/17   [provider]  tamsulosin (FLOMAX) 0.4 MG CAPS capsule TAKE 2 CAPSULES BY MOUTH AT BEDTIME 10/17/20     tamsulosin (FLOMAX) 0.4 MG CAPS capsule Take 2 capsules by mouth at bedtime. 02/17/21     triamcinolone ointment (KENALOG) 0.1 % Apply onto affected area twice a day for 14 days 10/15/21         Allergies    Voltaren [diclofenac sodium], Crestor [rosuvastatin], Lyrica [pregabalin], and Simvastatin    Review of Systems   Review of Systems  All other systems reviewed and are negative.  Physical Exam Updated Vital Signs BP (!) 124/95 (BP Location: Left Arm)   Pulse 69   Temp (!) 97.5 F (36.4 C) (Oral)   Resp 16   Ht 5' 11"   (1.803 m)   Wt 95.7 kg   SpO2 97%   BMI 29.43 kg/m  Physical Exam Vitals reviewed.  Constitutional:      Appearance: Normal appearance.  Cardiovascular:     Rate and Rhythm: Normal rate.  Pulmonary:     Effort: Pulmonary effort is normal.  Skin:    Comments: 1.5 cm laceration lateral to left thenar area.  Wound edges are dark.  Patient has full range of motion neurovascular neurosensory are intact  Neurological:     General: No focal deficit present.     Mental Status: He is alert.  Psychiatric:        Mood and Affect: Mood normal.    ED Results / Procedures / Treatments   Labs (all labs ordered are listed, but only abnormal results are displayed) Labs Reviewed - No data to display  EKG None  Radiology No results found.  Procedures .Marland KitchenLaceration Repair  Date/Time: 10/16/2021 11:25 PM Performed by: Fransico Meadow, PA-C Authorized by: Fransico Meadow, PA-C   Consent:    Consent obtained:  Verbal   Consent given by:  Patient   Risks discussed:  Infection   Alternatives discussed:  No treatment Universal protocol:    Procedure explained and questions answered to patient or proxy's satisfaction: yes     Immediately prior to procedure, a time out was called: yes     Patient identity confirmed:  Verbally with patient Anesthesia:    Anesthesia method:  Local infiltration   Local anesthetic:  Lidocaine 1% w/o epi Laceration details:    Location:  Hand   Length (cm):  1.5 Pre-procedure details:    Preparation:  Patient was prepped and draped in usual sterile fashion Exploration:    Limited defect created (wound extended): no     Imaging outcome: foreign body not noted   Treatment:    Area cleansed with:  Povidone-iodine   Amount of cleaning:  Standard   Irrigation solution:  Sterile saline   Debridement:  None   Undermining:  None Skin repair:    Repair method:  Sutures   Suture material:  Prolene   Suture technique:  Simple interrupted   Number of sutures:   4 Approximation:  Approximation:  Loose Repair type:    Repair type:  Simple Post-procedure details:    Procedure completion:  Tolerated well, no immediate complications    Medications Ordered in ED Medications  lidocaine (PF) (XYLOCAINE) 1 % injection 5 mL (5 mLs Intradermal Given by Other 10/16/21 1832)  povidone-iodine (BETADINE) 10 % external solution (  Given by Other 10/16/21 1832)    ED Course/ Medical Decision Making/ A&P                           Medical Decision Making Risk Prescription drug management.           Final Clinical Impression(s) / ED Diagnoses Final diagnoses:  Laceration of right hand, foreign body presence unspecified, initial encounter    Rx / DC Orders ED Discharge Orders     None      An After Visit Summary was printed and given to the patient.    Fransico Meadow, Hershal Coria 10/16/21 2327    Sherwood Gambler, MD 10/17/21 [phone]

## 2021-10-16 NOTE — ED Triage Notes (Signed)
Pt reports laceration to L hand on cut off wheel on grinder.  Bleeding is controlled at present.  Resp even and unlabored.  Skin warm and dry.  nad

## 2021-10-31 ENCOUNTER — Other Ambulatory Visit (HOSPITAL_COMMUNITY): Payer: Self-pay

## 2021-11-08 ENCOUNTER — Other Ambulatory Visit (HOSPITAL_COMMUNITY): Payer: Self-pay

## 2021-11-15 ENCOUNTER — Other Ambulatory Visit (HOSPITAL_COMMUNITY): Payer: Self-pay

## 2021-12-04 ENCOUNTER — Other Ambulatory Visit (HOSPITAL_COMMUNITY): Payer: Self-pay

## 2021-12-04 DIAGNOSIS — F1120 Opioid dependence, uncomplicated: Secondary | ICD-10-CM | POA: Diagnosis not present

## 2021-12-04 DIAGNOSIS — M5412 Radiculopathy, cervical region: Secondary | ICD-10-CM | POA: Diagnosis not present

## 2021-12-04 DIAGNOSIS — M5030 Other cervical disc degeneration, unspecified cervical region: Secondary | ICD-10-CM | POA: Diagnosis not present

## 2021-12-04 MED ORDER — HYDROCODONE-ACETAMINOPHEN 5-325 MG PO TABS
ORAL_TABLET | ORAL | 0 refills | Status: DC
  Filled 2022-02-16: qty 60, 20d supply, fill #0

## 2021-12-04 MED ORDER — HYDROCODONE-ACETAMINOPHEN 5-325 MG PO TABS
ORAL_TABLET | ORAL | 0 refills | Status: DC
  Filled 2021-12-16: qty 60, 30d supply, fill #0

## 2021-12-04 MED ORDER — HYDROCODONE-ACETAMINOPHEN 5-325 MG PO TABS
ORAL_TABLET | ORAL | 0 refills | Status: DC
  Filled 2022-01-15: qty 60, 20d supply, fill #0

## 2021-12-04 MED ORDER — CYCLOBENZAPRINE HCL 10 MG PO TABS
10.0000 mg | ORAL_TABLET | Freq: Three times a day (TID) | ORAL | 2 refills | Status: DC | PRN
  Filled 2021-12-04 – 2021-12-16 (×2): qty 90, 30d supply, fill #0
  Filled 2022-01-15: qty 90, 30d supply, fill #1
  Filled 2022-02-16: qty 90, 30d supply, fill #2

## 2021-12-16 ENCOUNTER — Other Ambulatory Visit (HOSPITAL_COMMUNITY): Payer: Self-pay

## 2021-12-16 MED ORDER — TRIAMCINOLONE ACETONIDE 0.1 % EX OINT
TOPICAL_OINTMENT | CUTANEOUS | 1 refills | Status: DC
  Filled 2021-12-16: qty 30, 14d supply, fill #0
  Filled 2022-01-15: qty 30, 14d supply, fill #1

## 2021-12-17 ENCOUNTER — Other Ambulatory Visit (HOSPITAL_COMMUNITY): Payer: Self-pay

## 2021-12-19 ENCOUNTER — Other Ambulatory Visit (HOSPITAL_COMMUNITY): Payer: Self-pay

## 2022-01-15 ENCOUNTER — Other Ambulatory Visit (HOSPITAL_COMMUNITY): Payer: Self-pay

## 2022-02-16 ENCOUNTER — Other Ambulatory Visit (HOSPITAL_COMMUNITY): Payer: Self-pay

## 2022-02-16 MED ORDER — TRIAMCINOLONE ACETONIDE 0.1 % EX OINT
TOPICAL_OINTMENT | CUTANEOUS | 1 refills | Status: AC
  Filled 2022-02-16: qty 30, 14d supply, fill #0
  Filled 2022-05-14: qty 30, 14d supply, fill #1

## 2022-02-17 ENCOUNTER — Other Ambulatory Visit (HOSPITAL_COMMUNITY): Payer: Self-pay

## 2022-02-17 MED ORDER — ALPRAZOLAM 1 MG PO TABS
0.5000 mg | ORAL_TABLET | Freq: Three times a day (TID) | ORAL | 3 refills | Status: DC | PRN
  Filled 2022-02-17: qty 45, 30d supply, fill #0
  Filled 2022-03-17: qty 45, 30d supply, fill #1
  Filled 2022-04-13: qty 45, 30d supply, fill #2

## 2022-02-18 ENCOUNTER — Other Ambulatory Visit (HOSPITAL_COMMUNITY): Payer: Self-pay

## 2022-02-20 ENCOUNTER — Other Ambulatory Visit (HOSPITAL_COMMUNITY): Payer: Self-pay

## 2022-02-20 DIAGNOSIS — Z125 Encounter for screening for malignant neoplasm of prostate: Secondary | ICD-10-CM | POA: Diagnosis not present

## 2022-02-20 DIAGNOSIS — Z23 Encounter for immunization: Secondary | ICD-10-CM | POA: Diagnosis not present

## 2022-02-20 DIAGNOSIS — J309 Allergic rhinitis, unspecified: Secondary | ICD-10-CM | POA: Diagnosis not present

## 2022-02-20 DIAGNOSIS — I10 Essential (primary) hypertension: Secondary | ICD-10-CM | POA: Diagnosis not present

## 2022-02-20 DIAGNOSIS — G720 Drug-induced myopathy: Secondary | ICD-10-CM | POA: Diagnosis not present

## 2022-02-20 DIAGNOSIS — F419 Anxiety disorder, unspecified: Secondary | ICD-10-CM | POA: Diagnosis not present

## 2022-02-20 DIAGNOSIS — E782 Mixed hyperlipidemia: Secondary | ICD-10-CM | POA: Diagnosis not present

## 2022-02-20 DIAGNOSIS — G8929 Other chronic pain: Secondary | ICD-10-CM | POA: Diagnosis not present

## 2022-02-20 DIAGNOSIS — R972 Elevated prostate specific antigen [PSA]: Secondary | ICD-10-CM | POA: Diagnosis not present

## 2022-02-20 DIAGNOSIS — K219 Gastro-esophageal reflux disease without esophagitis: Secondary | ICD-10-CM | POA: Diagnosis not present

## 2022-02-20 DIAGNOSIS — N400 Enlarged prostate without lower urinary tract symptoms: Secondary | ICD-10-CM | POA: Diagnosis not present

## 2022-02-20 DIAGNOSIS — Z13220 Encounter for screening for lipoid disorders: Secondary | ICD-10-CM | POA: Diagnosis not present

## 2022-02-20 DIAGNOSIS — Z0000 Encounter for general adult medical examination without abnormal findings: Secondary | ICD-10-CM | POA: Diagnosis not present

## 2022-02-20 MED ORDER — TAMSULOSIN HCL 0.4 MG PO CAPS
0.8000 mg | ORAL_CAPSULE | Freq: Every evening | ORAL | 4 refills | Status: DC
  Filled 2022-02-20: qty 180, 90d supply, fill #0

## 2022-02-20 MED ORDER — AMLODIPINE BESY-BENAZEPRIL HCL 5-10 MG PO CAPS
1.0000 | ORAL_CAPSULE | Freq: Every day | ORAL | 4 refills | Status: DC
  Filled 2022-02-20 – 2022-03-16 (×2): qty 90, 90d supply, fill #0

## 2022-02-20 MED ORDER — OMEPRAZOLE 40 MG PO CPDR
40.0000 mg | DELAYED_RELEASE_CAPSULE | Freq: Every morning | ORAL | 3 refills | Status: DC
  Filled 2022-02-20 – 2022-02-26 (×2): qty 90, 90d supply, fill #0

## 2022-02-20 MED ORDER — BETAMETHASONE DIPROPIONATE 0.05 % EX CREA
TOPICAL_CREAM | CUTANEOUS | 3 refills | Status: AC
  Filled 2022-02-20: qty 30, 30d supply, fill #0
  Filled 2022-06-16: qty 30, 30d supply, fill #1
  Filled 2022-07-29: qty 30, 30d supply, fill #2

## 2022-02-20 MED ORDER — BUSPIRONE HCL 30 MG PO TABS
ORAL_TABLET | ORAL | 4 refills | Status: DC
  Filled 2022-02-20: qty 225, 90d supply, fill #0
  Filled 2022-05-14: qty 360, 90d supply, fill #0
  Filled 2022-08-13: qty 360, 90d supply, fill #1
  Filled 2022-11-09 (×2): qty 360, 90d supply, fill #2
  Filled 2023-02-03: qty 360, 90d supply, fill #3

## 2022-02-20 MED ORDER — ALBUTEROL SULFATE HFA 108 (90 BASE) MCG/ACT IN AERS
INHALATION_SPRAY | RESPIRATORY_TRACT | 4 refills | Status: DC
  Filled 2022-02-20: qty 6.7, 16d supply, fill #0
  Filled 2022-04-13: qty 6.7, 30d supply, fill #1
  Filled 2022-05-14: qty 6.7, 30d supply, fill #2
  Filled 2022-06-16: qty 6.7, 16d supply, fill #3
  Filled 2022-07-29: qty 6.7, 16d supply, fill #4

## 2022-02-20 MED ORDER — EZETIMIBE 10 MG PO TABS
10.0000 mg | ORAL_TABLET | Freq: Every day | ORAL | 5 refills | Status: DC
  Filled 2022-02-20: qty 90, 90d supply, fill #0
  Filled 2022-06-03: qty 90, 90d supply, fill #1
  Filled 2022-09-03: qty 90, 90d supply, fill #2
  Filled 2022-12-01: qty 90, 90d supply, fill #3

## 2022-02-20 MED ORDER — ALPRAZOLAM 1 MG PO TABS
0.5000 mg | ORAL_TABLET | Freq: Three times a day (TID) | ORAL | 3 refills | Status: DC | PRN
  Filled 2022-05-14: qty 45, 30d supply, fill #0
  Filled 2022-06-16: qty 45, 30d supply, fill #1
  Filled 2022-07-16: qty 45, 30d supply, fill #2
  Filled 2022-08-13: qty 45, 30d supply, fill #3

## 2022-02-23 ENCOUNTER — Other Ambulatory Visit (HOSPITAL_COMMUNITY): Payer: Self-pay

## 2022-02-23 DIAGNOSIS — M5412 Radiculopathy, cervical region: Secondary | ICD-10-CM | POA: Diagnosis not present

## 2022-02-23 DIAGNOSIS — Z6828 Body mass index (BMI) 28.0-28.9, adult: Secondary | ICD-10-CM | POA: Diagnosis not present

## 2022-02-23 DIAGNOSIS — M5030 Other cervical disc degeneration, unspecified cervical region: Secondary | ICD-10-CM | POA: Diagnosis not present

## 2022-02-23 MED ORDER — CYCLOBENZAPRINE HCL 10 MG PO TABS
10.0000 mg | ORAL_TABLET | Freq: Three times a day (TID) | ORAL | 2 refills | Status: DC | PRN
  Filled 2022-02-23 – 2022-03-16 (×2): qty 90, 30d supply, fill #0
  Filled 2022-04-13: qty 90, 30d supply, fill #1
  Filled 2022-05-14: qty 90, 30d supply, fill #2

## 2022-02-23 MED ORDER — HYDROCODONE-ACETAMINOPHEN 5-325 MG PO TABS
1.0000 | ORAL_TABLET | ORAL | 0 refills | Status: DC
  Filled 2022-02-23: qty 60, 20d supply, fill #0
  Filled 2022-04-13 – 2022-04-16 (×2): qty 60, 30d supply, fill #0

## 2022-02-23 MED ORDER — HYDROCODONE-ACETAMINOPHEN 5-325 MG PO TABS
1.0000 | ORAL_TABLET | ORAL | 0 refills | Status: DC
  Filled 2022-02-23: qty 60, 20d supply, fill #0
  Filled 2022-03-17: qty 60, 30d supply, fill #0

## 2022-02-23 MED ORDER — HYDROCODONE-ACETAMINOPHEN 5-325 MG PO TABS
1.0000 | ORAL_TABLET | ORAL | 0 refills | Status: AC
  Filled 2022-02-23: qty 60, 20d supply, fill #0
  Filled 2022-05-14: qty 60, 30d supply, fill #0

## 2022-02-26 ENCOUNTER — Other Ambulatory Visit (HOSPITAL_COMMUNITY): Payer: Self-pay

## 2022-03-16 ENCOUNTER — Other Ambulatory Visit (HOSPITAL_COMMUNITY): Payer: Self-pay

## 2022-03-17 ENCOUNTER — Other Ambulatory Visit (HOSPITAL_COMMUNITY): Payer: Self-pay

## 2022-03-23 DIAGNOSIS — K219 Gastro-esophageal reflux disease without esophagitis: Secondary | ICD-10-CM | POA: Diagnosis not present

## 2022-03-23 DIAGNOSIS — Z1211 Encounter for screening for malignant neoplasm of colon: Secondary | ICD-10-CM | POA: Diagnosis not present

## 2022-04-13 ENCOUNTER — Other Ambulatory Visit (HOSPITAL_COMMUNITY): Payer: Self-pay

## 2022-04-13 ENCOUNTER — Other Ambulatory Visit: Payer: Self-pay

## 2022-04-16 ENCOUNTER — Other Ambulatory Visit (HOSPITAL_COMMUNITY): Payer: Self-pay

## 2022-04-24 ENCOUNTER — Other Ambulatory Visit (HOSPITAL_COMMUNITY): Payer: Self-pay

## 2022-05-05 ENCOUNTER — Other Ambulatory Visit (HOSPITAL_COMMUNITY): Payer: Self-pay

## 2022-05-05 DIAGNOSIS — I10 Essential (primary) hypertension: Secondary | ICD-10-CM | POA: Diagnosis not present

## 2022-05-05 MED ORDER — AMLODIPINE BESYLATE 5 MG PO TABS
5.0000 mg | ORAL_TABLET | Freq: Every day | ORAL | 5 refills | Status: DC
  Filled 2022-05-05: qty 90, 90d supply, fill #0
  Filled 2022-07-29: qty 90, 90d supply, fill #1
  Filled 2022-10-19: qty 90, 90d supply, fill #2
  Filled 2023-01-26: qty 90, 90d supply, fill #3
  Filled 2023-04-27: qty 90, 90d supply, fill #4

## 2022-05-12 ENCOUNTER — Observation Stay (HOSPITAL_COMMUNITY)
Admission: EM | Admit: 2022-05-12 | Discharge: 2022-05-13 | Disposition: A | Discharge status: Home / Self Care | Payer: 59 | Attending: Internal Medicine | Admitting: Internal Medicine

## 2022-05-12 ENCOUNTER — Other Ambulatory Visit: Payer: Self-pay

## 2022-05-12 ENCOUNTER — Emergency Department (HOSPITAL_COMMUNITY): Payer: 59

## 2022-05-12 ENCOUNTER — Encounter (HOSPITAL_COMMUNITY): Payer: Self-pay | Admitting: *Deleted

## 2022-05-12 DIAGNOSIS — G4730 Sleep apnea, unspecified: Secondary | ICD-10-CM | POA: Diagnosis present

## 2022-05-12 DIAGNOSIS — K6389 Other specified diseases of intestine: Secondary | ICD-10-CM | POA: Diagnosis not present

## 2022-05-12 DIAGNOSIS — K5289 Other specified noninfective gastroenteritis and colitis: Secondary | ICD-10-CM | POA: Insufficient documentation

## 2022-05-12 DIAGNOSIS — K529 Noninfective gastroenteritis and colitis, unspecified: Secondary | ICD-10-CM | POA: Diagnosis present

## 2022-05-12 DIAGNOSIS — Z79899 Other long term (current) drug therapy: Secondary | ICD-10-CM | POA: Insufficient documentation

## 2022-05-12 DIAGNOSIS — Z87891 Personal history of nicotine dependence: Secondary | ICD-10-CM | POA: Diagnosis not present

## 2022-05-12 DIAGNOSIS — F419 Anxiety disorder, unspecified: Secondary | ICD-10-CM | POA: Diagnosis present

## 2022-05-12 DIAGNOSIS — K625 Hemorrhage of anus and rectum: Secondary | ICD-10-CM | POA: Diagnosis not present

## 2022-05-12 DIAGNOSIS — G629 Polyneuropathy, unspecified: Secondary | ICD-10-CM

## 2022-05-12 DIAGNOSIS — K219 Gastro-esophageal reflux disease without esophagitis: Secondary | ICD-10-CM | POA: Diagnosis not present

## 2022-05-12 DIAGNOSIS — M47812 Spondylosis without myelopathy or radiculopathy, cervical region: Secondary | ICD-10-CM | POA: Diagnosis present

## 2022-05-12 DIAGNOSIS — K760 Fatty (change of) liver, not elsewhere classified: Secondary | ICD-10-CM | POA: Diagnosis present

## 2022-05-12 DIAGNOSIS — Z8719 Personal history of other diseases of the digestive system: Secondary | ICD-10-CM

## 2022-05-12 DIAGNOSIS — E785 Hyperlipidemia, unspecified: Secondary | ICD-10-CM | POA: Diagnosis present

## 2022-05-12 DIAGNOSIS — M1990 Unspecified osteoarthritis, unspecified site: Secondary | ICD-10-CM | POA: Diagnosis present

## 2022-05-12 LAB — HIV ANTIBODY (ROUTINE TESTING W REFLEX): HIV Screen 4th Generation wRfx: NONREACTIVE

## 2022-05-12 LAB — COMPREHENSIVE METABOLIC PANEL
ALT: 30 U/L (ref 0–44)
AST: 20 U/L (ref 15–41)
Albumin: 4.1 g/dL (ref 3.5–5.0)
Alkaline Phosphatase: 55 U/L (ref 38–126)
Anion gap: 10 (ref 5–15)
BUN: 13 mg/dL (ref 8–23)
CO2: 24 mmol/L (ref 22–32)
Calcium: 9.2 mg/dL (ref 8.9–10.3)
Chloride: 106 mmol/L (ref 98–111)
Creatinine, Ser: 1.11 mg/dL (ref 0.61–1.24)
GFR, Estimated: >60 mL/min (ref >60)
Glucose, Bld: 96 mg/dL (ref 70–99)
Potassium: 4.2 mmol/L (ref 3.5–5.1)
Sodium: 140 mmol/L (ref 135–145)
Total Bilirubin: 0.7 mg/dL (ref 0.3–1.2)
Total Protein: 7.2 g/dL (ref 6.5–8.1)

## 2022-05-12 LAB — CBC
HCT: 47.8 % (ref 39.0–52.0)
Hemoglobin: 16.3 g/dL (ref 13.0–17.0)
MCH: 31.3 pg (ref 26.0–34.0)
MCHC: 34.1 g/dL (ref 30.0–36.0)
MCV: 91.9 fL (ref 80.0–100.0)
Platelets: 200 10*3/uL (ref 150–400)
RBC: 5.2 MIL/uL (ref 4.22–5.81)
RDW: 12.8 % (ref 11.5–15.5)
WBC: 7.9 10*3/uL (ref 4.0–10.5)
nRBC: 0 % (ref 0.0–0.2)

## 2022-05-12 LAB — TYPE AND SCREEN
ABO/RH(D): A NEG
Antibody Screen: NEGATIVE

## 2022-05-12 MED ORDER — PANTOPRAZOLE SODIUM 40 MG PO TBEC
40.0000 mg | DELAYED_RELEASE_TABLET | Freq: Every day | ORAL | Status: DC
  Administered 2022-05-12 – 2022-05-13 (×2): 40 mg via ORAL
  Filled 2022-05-12 (×2): qty 1

## 2022-05-12 MED ORDER — ONDANSETRON HCL 4 MG/2ML IJ SOLN: 4.0000 mg | Freq: Four times a day (QID) | INTRAMUSCULAR | Status: DC | PRN

## 2022-05-12 MED ORDER — OXYCODONE HCL 5 MG PO TABS: 5.0000 mg | ORAL_TABLET | ORAL | Status: DC | PRN

## 2022-05-12 MED ORDER — CIPROFLOXACIN IN D5W 400 MG/200ML IV SOLN
400.0000 mg | Freq: Once | INTRAVENOUS | Status: AC
  Administered 2022-05-12: 400 mg via INTRAVENOUS
  Filled 2022-05-12: qty 200

## 2022-05-12 MED ORDER — CIPROFLOXACIN IN D5W 400 MG/200ML IV SOLN
400.0000 mg | Freq: Two times a day (BID) | INTRAVENOUS | Status: DC
  Administered 2022-05-12 – 2022-05-13 (×2): 400 mg via INTRAVENOUS
  Filled 2022-05-12 (×2): qty 200

## 2022-05-12 MED ORDER — ACETAMINOPHEN 650 MG RE SUPP: 650.0000 mg | Freq: Four times a day (QID) | RECTAL | Status: DC | PRN

## 2022-05-12 MED ORDER — METRONIDAZOLE 500 MG/100ML IV SOLN
500.0000 mg | Freq: Once | INTRAVENOUS | Status: AC
  Administered 2022-05-12: 500 mg via INTRAVENOUS
  Filled 2022-05-12: qty 100

## 2022-05-12 MED ORDER — ACETAMINOPHEN 325 MG PO TABS
650.0000 mg | ORAL_TABLET | Freq: Four times a day (QID) | ORAL | Status: DC | PRN
  Administered 2022-05-13 (×2): 650 mg via ORAL
  Filled 2022-05-12 (×2): qty 2

## 2022-05-12 MED ORDER — FENTANYL CITRATE PF 50 MCG/ML IJ SOSY: 25.0000 ug | PREFILLED_SYRINGE | INTRAMUSCULAR | Status: DC | PRN

## 2022-05-12 MED ORDER — IOHEXOL 300 MG/ML  SOLN
100.0000 mL | Freq: Once | INTRAMUSCULAR | Status: AC | PRN
  Administered 2022-05-12: 100 mL via INTRAVENOUS

## 2022-05-12 MED ORDER — SODIUM CHLORIDE 0.9 % IV SOLN: INTRAVENOUS | Status: DC

## 2022-05-12 MED ORDER — ONDANSETRON HCL 4 MG PO TABS: 4.0000 mg | ORAL_TABLET | Freq: Four times a day (QID) | ORAL | Status: DC | PRN

## 2022-05-12 MED ORDER — METRONIDAZOLE 500 MG/100ML IV SOLN
500.0000 mg | Freq: Two times a day (BID) | INTRAVENOUS | Status: DC
  Administered 2022-05-12 – 2022-05-13 (×2): 500 mg via INTRAVENOUS
  Filled 2022-05-12 (×2): qty 100

## 2022-05-12 NOTE — Hospital Course (Addendum)
61 year old gentleman with GERD, fatty liver, OSA, anxiety, arthritis, hiatal hernia and peripheral neuropathy presented to the emergency department complaining of bright red rectal blood started last night.  He has been having abdominal pain in the right to mid lower abdomen for the last week.  Pain initially started on the right side of the abdomen and then moved to the lower abdomen.  He has been having frequent stools approximately every 15 to 20 minutes with bright red blood seen in small amounts.  He reported about 3 tablespoons of bright red blood per stool.  He reported that he went to a restaurant and he at a steak that was extremely rare and bloody on Sat 05/09/22.    He was evaluated in the emergency department and sent for CT scan of the abdomen with findings of severe colitis of the transverse and descending colon.  GI service was consulted recommended starting IV antibiotics and obtain GI pathogen panel and start supportive care.  Patient is being admitted for further management.

## 2022-05-12 NOTE — ED Notes (Signed)
Report given to Tammy RN on 300

## 2022-05-12 NOTE — H&P (Signed)
History and Physical  [place] WHQ:[id number] DOB: 08-Jul-1960 DOA: 05/12/2022  PCP: Wenda Low, MD  Patient coming from: Home  Level of care: Med-Surg  I have personally briefly reviewed patient's old medical records in [place]  Chief Complaint: bright red blood in stool   HPI: [name] is a 61 year old gentleman with GERD, fatty liver, OSA, anxiety, arthritis, hiatal hernia and peripheral neuropathy presented to the emergency department complaining of bright red rectal blood started last night.  He has been having abdominal pain in the right to mid lower abdomen for the last week.  Pain initially started on the right side of the abdomen and then moved to the lower abdomen.  He has been having frequent stools approximately every 15 to 20 minutes with bright red blood seen in small amounts.  He reported about 3 tablespoons of bright red blood per stool.  He reported that he went to a restaurant and he at a steak that was extremely rare and bloody on Sat 05/09/22.    He was evaluated in the emergency department and sent for CT scan of the abdomen with findings of severe colitis of the transverse and descending colon.  GI service was consulted recommended starting IV antibiotics and obtain GI pathogen panel and start supportive care.  Patient is being admitted for further management.    Past Medical History:  Diagnosis Date   Anxiety    Arthritis    neck and hands   DJD (degenerative joint disease), cervical    Dyslipidemia    Fatty liver    GERD (gastroesophageal reflux disease)    H/O hiatal hernia    MRSA (methicillin resistant Staphylococcus aureus)    boil on abdomine in 2006   Peripheral neuropathy    Sleep apnea    breath right strips    Past Surgical History:  Procedure Laterality Date   BACK SURGERY     neck surgery X2   GANGLION CYST EXCISION     right hand   left elbow surgery torn muscle     right ear surgery      reports  that he quit smoking about 15 years ago. His smoking use included cigarettes. He has a 90.00 pack-year smoking history. He has never used smokeless tobacco. He reports current alcohol use of about 4.0 standard drinks of alcohol per week. He reports that he does not use drugs.  Allergies  Allergen Reactions   Voltaren [Diclofenac Sodium] Rash   Crestor [Rosuvastatin]     myalgia   Lyrica [Pregabalin]     MS changes   Simvastatin     myalgia    Family History  Problem Relation Age of Onset   Tuberculosis Father    Heart failure Mother    COPD Mother     Prior to Admission medications   Medication Sig Start Date End Date Taking? Authorizing Provider  albuterol (PROVENTIL HFA;VENTOLIN HFA) 108 (90 BASE) MCG/ACT inhaler Inhale 2 puffs into the lungs every 6 (six) hours as needed for wheezing or shortness of breath.    [provider]  albuterol (VENTOLIN HFA) 108 (90 Base) MCG/ACT inhaler INHALE 2 PUFFS BY MOUTH INTO THE LUNGS EVERY 4 HOURS AS NEEDED 08/20/20 08/20/21  Wenda Low, MD  albuterol (VENTOLIN HFA) 108 (90 Base) MCG/ACT inhaler INHALE 2 PUFFS BY MOUTH EVERY 4 HOURS AS NEEDED 12/13/19 12/12/20  Wenda Low, MD  albuterol (VENTOLIN HFA) 108 (90 Base) MCG/ACT inhaler INHALE 2  PUFFS BY MOUTH EVERY 4 HOURS AS NEEDED 02/20/22     ALPRAZolam (XANAX) 1 MG tablet Take 1 mg by mouth as needed.    [provider]  ALPRAZolam Duanne Moron) 1 MG tablet Take 1/2 tablet by mouth three times a day as needed (30 day supply) 12/13/20     ALPRAZolam (XANAX) 1 MG tablet Take 1/2 tablet by mouth three times a day as needed 02/17/21     ALPRAZolam (XANAX) 1 MG tablet Take 1/2 tablet by mouth 3 times daily as needed. 10/16/21     ALPRAZolam (XANAX) 1 MG tablet Take 0.5 tablets (0.5 mg total) by mouth 3 (three) times daily as needed for 30 days 02/17/22     ALPRAZolam (XANAX) 1 MG tablet Take 1/2 tablet (0.5 mg total) by mouth 3 (three) times daily as needed. 02/20/22     amLODipine (NORVASC)  5 MG tablet Take 1 tablet (5 mg total) by mouth daily. 05/05/22     amLODipine-benazepril (LOTREL) 5-10 MG capsule TAKE 1 CAPSULE BY MOUTH ONCE A DAY 08/20/20 08/20/21  Wenda Low, MD  amLODipine-benazepril (LOTREL) 5-10 MG capsule TAKE 1 CAPSULE BY MOUTH ONCE A DAY AS DIRECTED 07/22/20 07/22/21  Wenda Low, MD  amLODipine-benazepril (LOTREL) 5-10 MG capsule TAKE 1 CAPSULE BY MOUTH ONCE DAILY AS DIRECTED 01/04/20 01/03/21  Wenda Low, MD  amLODipine-benazepril (LOTREL) 5-10 MG capsule Take 1 capsule by mouth daily. 02/20/22     aspirin 81 MG EC tablet Take 81 mg by mouth daily.    [provider]  atorvastatin (LIPITOR) 10 MG tablet TAKE 1 TABLET BY MOUTH ONCE A DAY 06/11/20 06/11/21  Wenda Low, MD  atorvastatin (LIPITOR) 10 MG tablet Take 1 tablet by mouth once a day. 02/13/21     atorvastatin (LIPITOR) 10 MG tablet Take 1 tablet by mouth daily. 02/17/21     augmented betamethasone dipropionate (DIPROLENE-AF) 0.05 % cream Apply 1 application topically 2 (two) times daily. 02/22/20   [provider]  betamethasone dipropionate 0.05 % cream Apply to affected area once a day 02/20/22     busPIRone (BUSPAR) 10 MG tablet Take 10 mg by mouth 3 (three) times daily.    [provider]  busPIRone (BUSPAR) 30 MG tablet Take 2/3 of tablet (49m) by mouth three times daily 02/20/22     ciprofloxacin-hydrocortisone (CIPRO HC) OTIC suspension Place 3 drops into the right ear 2 (two) times daily. 12/19/17   HSharion Balloon FNP  [facility] KIT use as directed 10/29/20   SEdmon Crape RPH  cyclobenzaprine (FLEXERIL) 10 MG tablet Take 10 mg by mouth 3 (three) times daily as needed.    [provider]  cyclobenzaprine (FLEXERIL) 10 MG tablet Take 1 tablet (10 mg total) by mouth 3 (three) times daily as needed for muscle spasms. 02/23/22     ezetimibe (ZETIA) 10 MG tablet Take 1 tablet (10 mg total) by mouth daily. 02/20/22      fluticasone (FLONASE) 50 MCG/ACT nasal spray Place 2 sprays into both nostrils at bedtime. 07/15/21     HYDROcodone-acetaminophen (NORCO/VICODIN) 5-325 MG tablet Take 1 tablet by mouth every 8-12 hours as needed for chronic pain * Do not fill until 11/15/20 11/15/20     HYDROcodone-acetaminophen (NORCO/VICODIN) 5-325 MG tablet Take 1 tablet by mouth every 12 hours as needed for chronic pain ** Do Not fill until 10/14/20 10/14/20     HYDROcodone-acetaminophen (NORCO/VICODIN) 5-325 MG tablet Take 1 tablet by  mouth every 8-12 hours as needed for chronic pain ( DNF UNTIL 01/13/2021) 12/16/20     HYDROcodone-acetaminophen (NORCO/VICODIN) 5-325 MG tablet Take 1 tablet by mouth every 8-12 hours as needed for chronic pain (DNF UNTIL 02/13/2021) 12/16/20     HYDROcodone-acetaminophen (NORCO/VICODIN) 5-325 MG tablet Take 1 tablet by mouth every 12 hours as needed for chronic pain (DNF UNTIL 03/14/2021) 12/16/20     HYDROcodone-acetaminophen (NORCO/VICODIN) 5-325 MG tablet Take 1 tablet by mouth every 12 hours as needed for chronic pain (DNF UNTIL 03/14/2021) 03/11/21     HYDROcodone-acetaminophen (NORCO/VICODIN) 5-325 MG tablet Take one tablet by mouth every 8-12 hours as needed for chronic pain ( DNF UNTIL 05/15/2021) 03/11/21     HYDROcodone-acetaminophen (NORCO/VICODIN) 5-325 MG tablet Take one tablet by mouth every 8-12 hours as needed for chronic pain (DNF UNTIL 04/15/2021) 03/11/21     HYDROcodone-acetaminophen (NORCO/VICODIN) 5-325 MG tablet Take 1 tablet by mouth every 8 to 12 hours as needed for chronic pain  (DNF 07/14/21) 06/11/21     HYDROcodone-acetaminophen (NORCO/VICODIN) 5-325 MG tablet Take 1 tablet by mouth every 8 to 12 hours as needed for chronic pain (DNF 06/14/21) 06/11/21     HYDROcodone-acetaminophen (NORCO/VICODIN) 5-325 MG tablet Take 1 tablet by mouth every 8 to 12 hours as needed for chronic pain (DNF 08/12/21) 06/11/21     HYDROcodone-acetaminophen (NORCO/VICODIN) 5-325 MG tablet Take 1 tablet by  mouth every 8-12 hours as needed for chronic pain (DNF 10/12/21) 10/12/21     HYDROcodone-acetaminophen (NORCO/VICODIN) 5-325 MG tablet Take 1 tablet by mouth every 8-12 hours as needed for chronic pain (DNF 09/13/21) 09/13/21     HYDROcodone-acetaminophen (NORCO/VICODIN) 5-325 MG tablet Take one tablet by mouth every 8-12 hours as needed for chronic pain (DNF 01/13/22) 12/04/21     HYDROcodone-acetaminophen (NORCO/VICODIN) 5-325 MG tablet Take 1 tablet by mouth every 8 - 12 hours as needed for chronic pain (DNF 12/14/21) 12/04/21     HYDROcodone-acetaminophen (NORCO/VICODIN) 5-325 MG tablet Take 1 tablet by mouth every 8-12 hours as needed for chronic pain. 02/23/22     HYDROcodone-acetaminophen (NORCO/VICODIN) 5-325 MG tablet Take 1 tablet by mouth every 8-12 hours as needed for chronic pain. 02/23/22     HYDROcodone-acetaminophen (NORCO/VICODIN) 5-325 MG tablet Take 1 tablet by mouth every 8-12 hours as needed for chronic pain. 02/23/22     HYDROcodone-acetaminophen (VICODIN) 5-500 MG per tablet Take 1 tablet by mouth every 6 (six) hours as needed.    [provider]  lovastatin (MEVACOR) 10 MG tablet TAKE 1 TABLET BY MOUTH AT BEDTIME 05/16/20 05/16/21  Donato Heinz, MD  metoprolol tartrate (LOPRESSOR) 100 MG tablet TAKE 1 TABLET BY MOUTH 2 HOURS PRIOR TO CT 03/12/20 03/12/21  Donato Heinz, MD  neomycin-polymyxin-hydrocortisone (CORTISPORIN) OTIC solution Place 4 drops into the right ear 4 (four) times daily. 12/20/17   Sharion Balloon, FNP  omeprazole (PRILOSEC) 40 MG capsule Take 1 capsule by mouth 2 times a day 30 minutes before a meal 09/29/21     omeprazole (PRILOSEC) 40 MG capsule Take 1 capsule (40 mg total) by mouth every morning 30 minutes before morning meal 02/20/22     pantoprazole (PROTONIX) 40 MG tablet Take 30-60 min before first meal of the day 05/22/13   Tanda Rockers, MD  pantoprazole (PROTONIX) 40 MG tablet TAKE 1 TABLET BY MOUTH 2 TIMES DAILY 05/16/20 05/16/21   Wenda Low, MD  sucralfate (CARAFATE) 1 g tablet TAKE 1 TABLET BY MOUTH TWICE DAILY ON AN  EMPTY STOMACH (BEFORE FOOD) 04/08/20 04/08/21  Vladimir Crofts, PA-C  tamsulosin (FLOMAX) 0.4 MG CAPS capsule Take 1 capsule by mouth in the morning and at bedtime. 12/13/17   [provider]  tamsulosin (FLOMAX) 0.4 MG CAPS capsule TAKE 2 CAPSULES BY MOUTH AT BEDTIME 10/17/20     tamsulosin (FLOMAX) 0.4 MG CAPS capsule Take 2 capsules (0.8 mg total) by mouth at bedtime. 02/20/22     triamcinolone ointment (KENALOG) 0.1 % Apply to affected area twice a day for 14 days 02/16/22       Physical Exam: Vitals:   05/12/22 1137 05/12/22 1316 05/12/22 1442 05/12/22 1701  BP:  (!) 141/91 (!) 140/93 (!) 129/97  Pulse:  68 72 73  Resp:  _0 Temp:  98.2 F (36.8 C)  98.1 F (36.7 C)  TempSrc:  Oral  Oral  SpO2:  96% 97% 96%  Weight: 90.7 kg     Height: _1  (1.803 m)      Constitutional: NAD, calm, comfortable Eyes: PERRL, lids and conjunctivae normal ENMT: Mucous membranes are moist. Posterior pharynx clear of any exudate or lesions.Normal dentition.  Neck: normal, supple, no masses, no thyromegaly Respiratory: clear to auscultation bilaterally, no wheezing, no crackles. Normal respiratory effort. No accessory muscle use.  Cardiovascular: normal s1, s2 sounds, no murmurs / rubs / gallops. No extremity edema. 2+ pedal pulses. No carotid bruits.  Abdomen: diffuse right mid to lower abdominal tenderness, no masses palpated. No hepatosplenomegaly. Bowel sounds positive.  Musculoskeletal: no clubbing / cyanosis. No joint deformity upper and lower extremities. Good ROM, no contractures. Normal muscle tone.  Skin: no rashes, lesions, ulcers. No induration Neurologic: CN 2-12 grossly intact. Sensation intact, DTR normal. Strength 5/5 in all 4.  Psychiatric: Normal judgment and insight. Alert and oriented x 3. Normal mood.   Labs on Admission: I have personally reviewed following labs and imaging  studies  CBC: Recent Labs  Lab 05/12/22 1203  WBC 7.9  HGB 16.3  HCT 47.8  MCV 91.9  PLT 448   Basic Metabolic Panel: Recent Labs  Lab 05/12/22 1203  NA 140  K 4.2  CL 106  CO2 24  GLUCOSE 96  BUN 13  CREATININE 1.11  CALCIUM 9.2   GFR: Estimated Creatinine Clearance: 79.5 mL/min (by C-G formula based on SCr of 1.11 mg/dL). Liver Function Tests: Recent Labs  Lab 05/12/22 1203  AST 20  ALT 30  ALKPHOS 55  BILITOT 0.7  PROT 7.2  ALBUMIN 4.1   No results for input(s): "LIPASE", "AMYLASE" in the last 168 hours. No results for input(s): "AMMONIA" in the last 168 hours. Coagulation Profile: No results for input(s): "INR", "PROTIME" in the last 168 hours. Cardiac Enzymes: No results for input(s): "CKTOTAL", "CKMB", "CKMBINDEX", "TROPONINI" in the last 168 hours. BNP (last 3 results) No results for input(s): "PROBNP" in the last 8760 hours. HbA1C: No results for input(s): "HGBA1C" in the last 72 hours. CBG: No results for input(s): "GLUCAP" in the last 168 hours. Lipid Profile: No results for input(s): "CHOL", "HDL", "LDLCALC", "TRIG", "CHOLHDL", "LDLDIRECT" in the last 72 hours. Thyroid Function Tests: No results for input(s): "TSH", "T4TOTAL", "FREET4", "T3FREE", "THYROIDAB" in the last 72 hours. Anemia Panel: No results for input(s): "VITAMINB12", "FOLATE", "FERRITIN", "TIBC", "IRON", "RETICCTPCT" in the last 72 hours. Urine analysis:    Component Value Date/Time   COLORURINE GREEN BIOCHEMICALS MAY BE AFFECTED BY COLOR (A) 06/29/2007 1218   APPEARANCEUR CLEAR 06/29/2007 1218   LABSPEC 1.010  06/29/2007 1218   PHURINE 5.5 06/29/2007 [place] 06/29/2007 1218   HGBUR LARGE (A) 06/29/2007 [employer] 06/29/2007 [place] 06/29/2007 1218   PROTEINUR NEGATIVE 06/29/2007 1218   UROBILINOGEN 0.2 06/29/2007 1218   NITRITE NEGATIVE 06/29/2007 [place] 06/29/2007 1218    Radiological Exams on  Admission: CT Abdomen Pelvis W Contrast  Result Date: 05/12/2022 CLINICAL DATA:  Diverticulitis. Complication suspected. Complains of bright red blood that started last night. EXAM: CT ABDOMEN AND PELVIS WITH CONTRAST TECHNIQUE: Multidetector CT imaging of the abdomen and pelvis was performed using the standard protocol following bolus administration of intravenous contrast. RADIATION DOSE REDUCTION: This exam was performed according to the departmental dose-optimization program which includes automated exposure control, adjustment of the mA and/or kV according to patient size and/or use of iterative reconstruction technique. CONTRAST:  113m OMNIPAQUE IOHEXOL 300 MG/ML  SOLN COMPARISON:  CT examination dated December 08, 2017. FINDINGS: Lower chest: No acute abnormality. Hepatobiliary: No focal liver abnormality is seen. No gallstones, gallbladder wall thickening, or biliary dilatation. Pancreas: Mild generalized pancreatic atrophy prominent in the head and neck. No pancreatic ductal dilatation or surrounding inflammatory changes. Spleen: Normal in size without focal abnormality. Adrenals/Urinary Tract: Adrenal glands are unremarkable. Kidneys are normal, without renal calculi, focal lesion, or hydronephrosis. Bladder is unremarkable. Stomach/Bowel: Small hiatal hernia. Stomach and small bowel loops are normal. Normal appendix. There is marked thickening of the transverse and descending colonic wall suggesting severe colitis. Mild adjacent fat stranding. Sigmoid colon and rectum are unremarkable. No evidence of acute diverticulitis. Vascular/Lymphatic: Mild aortic atherosclerotic calcifications. No lymphadenopathy. Reproductive: Prostate is unremarkable. Other: Small fat containing left inguinal hernia. No abdominopelvic ascites. Musculoskeletal: Degenerate disc disease of the lumbar spine. No acute osseous abnormality. IMPRESSION: 1. Marked thickening of the transverse and descending colonic wall with mild  adjacent fat stranding consistent with severe colitis. This is likely secondary to infectious/inflammatory process. 2. Small hiatal hernia. 3. Normal appendix. 4. No evidence of acute diverticulitis. 5. Small fat containing left inguinal hernia. 6. Degenerate disc disease of the lumbar spine. 7. Mild aortic atherosclerosis. Electronically Signed   By: IKeane PoliceD.O.   On: 05/12/2022 14:20    EKG: Independently reviewed.   Assessment/Plan Principal Problem:   Bright red rectal bleeding Active Problems:   Colitis presumed infectious   Sleep apnea   Peripheral neuropathy   H/O hiatal hernia   GERD (gastroesophageal reflux disease)   Fatty liver   Dyslipidemia   DJD (degenerative joint disease), cervical   Arthritis   Anxiety   Severe Colitis with rectal bleeding - involving transverse and descending colon - obtain GI path panel and c diff screen as part of work up for infectious causes - recent rare bloody steak consumption concerning for infectious cause - continue IV ciprofloxacin and metronidazole - continue IV fluid hydration  - clear liquid diet  - pain management as needed - nausea management as needed - appreciate GI service consultation and recommendations - CBC and BMP in AM   OSA - will offer nightly CPAP while inpatient  GERD - protonix ordered for GI protection  Dislipidemia   OA with DJD  - acetaminophen and other analgesics as needed  DVT prophylaxis: SCDs  Code Status: full   Family Communication: wife bedside   Disposition Plan: anticipate home   Consults called: GI service   Admission status: OBS   Level of care: Med-Surg CIrwin BrakemanMD [facility] How to contact the [facility] Attending or Consulting provider [place] or covering provider during after hours [place], for this patient?  Check the care team in [facility] and look for a) attending/consulting [facility] provider listed and b) the [facility] team listed Log into [email or web address] and use [place]'s  universal password to access. If you do not have the password, please contact the hospital operator. Locate the [facility] provider you are looking for under [facility] and page to a number that you can be directly reached. If you still have difficulty reaching the provider, please page the [facility] (Director on Call) for the Hospitalists listed on amion for assistance.   If 7PM-7AM, please contact night-coverage [email or web address] Password [facility]  05/12/2022, 5:12 PM

## 2022-05-12 NOTE — ED Provider Notes (Signed)
[name] EMERGENCY DEPARTMENT Provider Note   CSN: [id number] Arrival date & time: 05/12/22  1105     History  This patient with a Hx of *** presents to the ED for concern of ***, this involves an extensive number of treatment options, and is a complaint that carries with it a high risk of complications and morbidity.    The differential diagnosis includes ***   Social Determinants of Health:  ***  Additional history obtained:  Additional history and/or information obtained from *** , notable for ***   After the initial evaluation, orders, including: *** were initiated.   Patient placed on Cardiac and Pulse-Oximetry Monitors. The patient was maintained on a cardiac monitor.  The cardiac monitored showed an rhythm of *** The patient was also maintained on pulse oximetry. The readings were typically ***   On repeat evaluation of the patient {resolved/improved/worsened:23923::"improved"}  Lab Tests:  I personally interpreted labs.  The pertinent results include:  ***  Imaging Studies ordered:  I independently visualized and interpreted imaging which showed *** I agree with the radiologist interpretation  Consultations Obtained:  I requested consultation with the ***,  and discussed lab and imaging findings as well as pertinent plan - they recommend: ***  Dispostion / Final MDM:  After consideration of the diagnostic results and the patient's response to treatment, ***   Chief Complaint  Patient presents with   Rectal Bleeding    [name] is a 61 y.o. male.  HPI Patient presents with his wife and concern for abdominal pain and rectal bleeding.  Onset was 3 days ago, initially with pain, diffuse across the lower abdomen, rectal bleeding began yesterday, now with only production of bright red blood per rectum, no bowel movements.  No lightheadedness, no chest pain, no dyspnea.  He is not anticoagulated.  Last colonoscopy was 5 years ago.    Home  Medications Prior to Admission medications   Medication Sig Start Date End Date Taking? Authorizing Provider  albuterol (PROVENTIL HFA;VENTOLIN HFA) 108 (90 BASE) MCG/ACT inhaler Inhale 2 puffs into the lungs every 6 (six) hours as needed for wheezing or shortness of breath.    [provider]  albuterol (VENTOLIN HFA) 108 (90 Base) MCG/ACT inhaler INHALE 2 PUFFS BY MOUTH INTO THE LUNGS EVERY 4 HOURS AS NEEDED 08/20/20 08/20/21  Wenda Low, MD  albuterol (VENTOLIN HFA) 108 (90 Base) MCG/ACT inhaler INHALE 2 PUFFS BY MOUTH EVERY 4 HOURS AS NEEDED 12/13/19 12/12/20  Wenda Low, MD  albuterol (VENTOLIN HFA) 108 (90 Base) MCG/ACT inhaler INHALE 2 PUFFS BY MOUTH EVERY 4 HOURS AS NEEDED 02/20/22     ALPRAZolam (XANAX) 1 MG tablet Take 1 mg by mouth as needed.    [provider]  ALPRAZolam Duanne Moron) 1 MG tablet Take 1/2 tablet by mouth three times a day as needed (30 day supply) 12/13/20     ALPRAZolam (XANAX) 1 MG tablet Take 1/2 tablet by mouth three times a day as needed 02/17/21     ALPRAZolam (XANAX) 1 MG tablet Take 1/2 tablet by mouth 3 times daily as needed. 10/16/21     ALPRAZolam (XANAX) 1 MG tablet Take 0.5 tablets (0.5 mg total) by mouth 3 (three) times daily as needed for 30 days 02/17/22     ALPRAZolam (XANAX) 1 MG tablet Take 1/2 tablet (0.5 mg total) by mouth 3 (three) times daily as needed. 02/20/22     amLODipine (NORVASC) 5 MG tablet Take 1 tablet (5 mg total) by  mouth daily. 05/05/22     amLODipine-benazepril (LOTREL) 5-10 MG capsule TAKE 1 CAPSULE BY MOUTH ONCE A DAY 08/20/20 08/20/21  Wenda Low, MD  amLODipine-benazepril (LOTREL) 5-10 MG capsule TAKE 1 CAPSULE BY MOUTH ONCE A DAY AS DIRECTED 07/22/20 07/22/21  Wenda Low, MD  amLODipine-benazepril (LOTREL) 5-10 MG capsule TAKE 1 CAPSULE BY MOUTH ONCE DAILY AS DIRECTED 01/04/20 01/03/21  Wenda Low, MD  amLODipine-benazepril (LOTREL) 5-10 MG capsule Take 1 capsule by mouth daily. 02/20/22     aspirin 81 MG EC tablet  Take 81 mg by mouth daily.    [provider]  atorvastatin (LIPITOR) 10 MG tablet TAKE 1 TABLET BY MOUTH ONCE A DAY 06/11/20 06/11/21  Wenda Low, MD  atorvastatin (LIPITOR) 10 MG tablet Take 1 tablet by mouth once a day. 02/13/21     atorvastatin (LIPITOR) 10 MG tablet Take 1 tablet by mouth daily. 02/17/21     augmented betamethasone dipropionate (DIPROLENE-AF) 0.05 % cream Apply 1 application topically 2 (two) times daily. 02/22/20   [provider]  betamethasone dipropionate 0.05 % cream Apply to affected area once a day 02/20/22     busPIRone (BUSPAR) 10 MG tablet Take 10 mg by mouth 3 (three) times daily.    [provider]  busPIRone (BUSPAR) 30 MG tablet Take 2/3 of tablet (28m) by mouth three times daily 02/20/22     ciprofloxacin-hydrocortisone (CIPRO HC) OTIC suspension Place 3 drops into the right ear 2 (two) times daily. 12/19/17   HSharion Balloon FNP  [facility] KIT use as directed 10/29/20   SEdmon Crape RPH  cyclobenzaprine (FLEXERIL) 10 MG tablet Take 10 mg by mouth 3 (three) times daily as needed.    [provider]  cyclobenzaprine (FLEXERIL) 10 MG tablet Take 1 tablet (10 mg total) by mouth 3 (three) times daily as needed for muscle spasms. 02/23/22     ezetimibe (ZETIA) 10 MG tablet Take 1 tablet (10 mg total) by mouth daily. 02/20/22     fluticasone (FLONASE) 50 MCG/ACT nasal spray Place 2 sprays into both nostrils at bedtime. 07/15/21     HYDROcodone-acetaminophen (NORCO/VICODIN) 5-325 MG tablet Take 1 tablet by mouth every 8-12 hours as needed for chronic pain * Do not fill until 11/15/20 11/15/20     HYDROcodone-acetaminophen (NORCO/VICODIN) 5-325 MG tablet Take 1 tablet by mouth every 12 hours as needed for chronic pain ** Do Not fill until 10/14/20 10/14/20     HYDROcodone-acetaminophen (NORCO/VICODIN) 5-325 MG tablet Take 1 tablet by mouth every 8-12 hours as needed for chronic pain ( DNF  UNTIL 01/13/2021) 12/16/20     HYDROcodone-acetaminophen (NORCO/VICODIN) 5-325 MG tablet Take 1 tablet by mouth every 8-12 hours as needed for chronic pain (DNF UNTIL 02/13/2021) 12/16/20     HYDROcodone-acetaminophen (NORCO/VICODIN) 5-325 MG tablet Take 1 tablet by mouth every 12 hours as needed for chronic pain (DNF UNTIL 03/14/2021) 12/16/20     HYDROcodone-acetaminophen (NORCO/VICODIN) 5-325 MG tablet Take 1 tablet by mouth every 12 hours as needed for chronic pain (DNF UNTIL 03/14/2021) 03/11/21     HYDROcodone-acetaminophen (NORCO/VICODIN) 5-325 MG tablet Take one tablet by mouth every 8-12 hours as needed for chronic pain ( DNF UNTIL 05/15/2021) 03/11/21     HYDROcodone-acetaminophen (NORCO/VICODIN) 5-325 MG tablet Take one tablet by mouth every 8-12 hours as needed for chronic pain (DNF UNTIL 04/15/2021) 03/11/21     HYDROcodone-acetaminophen (NORCO/VICODIN) 5-325 MG tablet Take 1 tablet by mouth every  8 to 12 hours as needed for chronic pain  (DNF 07/14/21) 06/11/21     HYDROcodone-acetaminophen (NORCO/VICODIN) 5-325 MG tablet Take 1 tablet by mouth every 8 to 12 hours as needed for chronic pain (DNF 06/14/21) 06/11/21     HYDROcodone-acetaminophen (NORCO/VICODIN) 5-325 MG tablet Take 1 tablet by mouth every 8 to 12 hours as needed for chronic pain (DNF 08/12/21) 06/11/21     HYDROcodone-acetaminophen (NORCO/VICODIN) 5-325 MG tablet Take 1 tablet by mouth every 8-12 hours as needed for chronic pain (DNF 10/12/21) 10/12/21     HYDROcodone-acetaminophen (NORCO/VICODIN) 5-325 MG tablet Take 1 tablet by mouth every 8-12 hours as needed for chronic pain (DNF 09/13/21) 09/13/21     HYDROcodone-acetaminophen (NORCO/VICODIN) 5-325 MG tablet Take one tablet by mouth every 8-12 hours as needed for chronic pain (DNF 01/13/22) 12/04/21     HYDROcodone-acetaminophen (NORCO/VICODIN) 5-325 MG tablet Take 1 tablet by mouth every 8 - 12 hours as needed for chronic pain (DNF 12/14/21) 12/04/21     HYDROcodone-acetaminophen  (NORCO/VICODIN) 5-325 MG tablet Take 1 tablet by mouth every 8-12 hours as needed for chronic pain. 02/23/22     HYDROcodone-acetaminophen (NORCO/VICODIN) 5-325 MG tablet Take 1 tablet by mouth every 8-12 hours as needed for chronic pain. 02/23/22     HYDROcodone-acetaminophen (NORCO/VICODIN) 5-325 MG tablet Take 1 tablet by mouth every 8-12 hours as needed for chronic pain. 02/23/22     HYDROcodone-acetaminophen (VICODIN) 5-500 MG per tablet Take 1 tablet by mouth every 6 (six) hours as needed.    [provider]  lovastatin (MEVACOR) 10 MG tablet TAKE 1 TABLET BY MOUTH AT BEDTIME 05/16/20 05/16/21  Donato Heinz, MD  metoprolol tartrate (LOPRESSOR) 100 MG tablet TAKE 1 TABLET BY MOUTH 2 HOURS PRIOR TO CT 03/12/20 03/12/21  Donato Heinz, MD  neomycin-polymyxin-hydrocortisone (CORTISPORIN) OTIC solution Place 4 drops into the right ear 4 (four) times daily. 12/20/17   Sharion Balloon, FNP  omeprazole (PRILOSEC) 40 MG capsule Take 1 capsule by mouth 2 times a day 30 minutes before a meal 09/29/21     omeprazole (PRILOSEC) 40 MG capsule Take 1 capsule (40 mg total) by mouth every morning 30 minutes before morning meal 02/20/22     pantoprazole (PROTONIX) 40 MG tablet Take 30-60 min before first meal of the day 05/22/13   Tanda Rockers, MD  pantoprazole (PROTONIX) 40 MG tablet TAKE 1 TABLET BY MOUTH 2 TIMES DAILY 05/16/20 05/16/21  Wenda Low, MD  sucralfate (CARAFATE) 1 g tablet TAKE 1 TABLET BY MOUTH TWICE DAILY ON AN EMPTY STOMACH (BEFORE FOOD) 04/08/20 04/08/21  Vladimir Crofts, PA-C  tamsulosin (FLOMAX) 0.4 MG CAPS capsule Take 1 capsule by mouth in the morning and at bedtime. 12/13/17   [provider]  tamsulosin (FLOMAX) 0.4 MG CAPS capsule TAKE 2 CAPSULES BY MOUTH AT BEDTIME 10/17/20     tamsulosin (FLOMAX) 0.4 MG CAPS capsule Take 2 capsules (0.8 mg total) by mouth at bedtime. 02/20/22     triamcinolone ointment (KENALOG) 0.1 % Apply to affected area twice a  day for 14 days 02/16/22         Allergies    Voltaren [diclofenac sodium], Crestor [rosuvastatin], Lyrica [pregabalin], and Simvastatin    Review of Systems   Review of Systems  All other systems reviewed and are negative.   Physical Exam Updated Vital Signs BP (!) 141/91 (BP Location: Left Arm)   Pulse 68   Temp 98.2 F (36.8 C) (Oral)   Resp  18   Ht _0  (1.803 m)   Wt 90.7 kg   SpO2 96%   BMI 27.89 kg/m  Physical Exam Vitals and nursing note reviewed.  Constitutional:      General: He is not in acute distress.    Appearance: He is well-developed.  HENT:     Head: Normocephalic and atraumatic.  Eyes:     Conjunctiva/sclera: Conjunctivae normal.  Cardiovascular:     Rate and Rhythm: Normal rate and regular rhythm.  Pulmonary:     Effort: Pulmonary effort is normal. No respiratory distress.     Breath sounds: No stridor.  Abdominal:     General: There is no distension.     Tenderness: There is abdominal tenderness.  Skin:    General: Skin is warm and dry.  Neurological:     Mental Status: He is alert and oriented to person, place, and time.     ED Results / Procedures / Treatments   Labs (all labs ordered are listed, but only abnormal results are displayed) Labs Reviewed  COMPREHENSIVE METABOLIC PANEL  CBC  POC OCCULT BLOOD, ED  TYPE AND SCREEN    EKG None  Radiology No results found.  Procedures Procedures  {Document cardiac monitor, telemetry assessment procedure when appropriate:1}  Medications Ordered in ED Medications  iohexol (OMNIPAQUE) 300 MG/ML solution 100 mL (100 mLs Intravenous Contrast Given 05/12/22 1355)    ED Course/ Medical Decision Making/ A&P .[id number] Final Clinical Impression(s) / ED Diagnoses Final diagnoses:  None    Rx / DC Orders ED Discharge Orders     None

## 2022-05-12 NOTE — Consult Note (Signed)
Gastroenterology Consult   Referring Provider: No ref. provider found Primary Care Physician:  Wenda Low, MD Primary Gastroenterologist:  previously Dr Paulita Fujita   Patient ID: [name]; [id number]; [birth year]   Admit date: 05/12/2022  LOS: 0 days   Date of Consultation: 05/12/2022  Reason for Consultation:  Colitis   History of Present Illness   [name] is a 61 y.o. year old male with medical history of anxiety, arthritis, dyslipidemia, fatty liver, GERD, hiatal hernia, peripheral neuropathy, sleep apnea who presented to the ED with abdominal pain, diarrhea and rectal bleeding.   ED course: Hemodynamically stable CBC, CMP unremarkable POC Occult needs to be collected CT A/P with contrast with Marked thickening of the transverse and descending colonic wall with mild adjacent fat stranding consistent with severe colitis. This is likely secondary to infectious/inflammatory process.  Consult:  Patient reports abdominal cramping that started a few days ago with some mid to right lower quadrant pain.  Last night he began having looser stools that were followed by bright red rectal bleeding noting that at one point he had only blood coming from his rectum, noting approximately 3 T of blood.  He denies overt fevers but had some cold sweats last night as well as a little bit of nausea.  Today bowel movements have slowed he has only had a few episodes of diarrhea whereas last night he was having a stool about every 15 minutes of watery diarrhea.  He denies any recent sick contacts.  Does note that he ate a pretty rare steak over the weekend.  He does take 2 tablets of Motrin daily for chronic neck pain.  Denies any melena.  No weight loss or other changes in bowel habits prior to acute illness.   Interestingly he does note that his hypertension medication was recently changed as he was having episodes of hypotension.  This change occurred about 3 weeks ago, he still reports some  intermittent hypotensive episodes.   EGD: 2013 small hh, distal erosive esophagitis, mild gastritis Colonoscopy: maybe 5 years ago, [facility]   Past Medical History:  Diagnosis Date   Anxiety    Arthritis    neck and hands   DJD (degenerative joint disease), cervical    Dyslipidemia    Fatty liver    GERD (gastroesophageal reflux disease)    H/O hiatal hernia    MRSA (methicillin resistant Staphylococcus aureus)    boil on abdomine in 2006   Peripheral neuropathy    Sleep apnea    breath right strips    Past Surgical History:  Procedure Laterality Date   BACK SURGERY     neck surgery X2   GANGLION CYST EXCISION     right hand   left elbow surgery torn muscle     right ear surgery      Prior to Admission medications   Medication Sig Start Date End Date Taking? Authorizing Provider  albuterol (PROVENTIL HFA;VENTOLIN HFA) 108 (90 BASE) MCG/ACT inhaler Inhale 2 puffs into the lungs every 6 (six) hours as needed for wheezing or shortness of breath.    [provider]  albuterol (VENTOLIN HFA) 108 (90 Base) MCG/ACT inhaler INHALE 2 PUFFS BY MOUTH INTO THE LUNGS EVERY 4 HOURS AS NEEDED 08/20/20 08/20/21  Wenda Low, MD  albuterol (VENTOLIN HFA) 108 (90 Base) MCG/ACT inhaler INHALE 2 PUFFS BY MOUTH EVERY 4 HOURS AS NEEDED 12/13/19 12/12/20  Wenda Low, MD  albuterol (VENTOLIN HFA) 108 (90 Base) MCG/ACT inhaler INHALE  2 PUFFS BY MOUTH EVERY 4 HOURS AS NEEDED 02/20/22     ALPRAZolam (XANAX) 1 MG tablet Take 1 mg by mouth as needed.    [provider]  ALPRAZolam Duanne Moron) 1 MG tablet Take 1/2 tablet by mouth three times a day as needed (30 day supply) 12/13/20     ALPRAZolam (XANAX) 1 MG tablet Take 1/2 tablet by mouth three times a day as needed 02/17/21     ALPRAZolam (XANAX) 1 MG tablet Take 1/2 tablet by mouth 3 times daily as needed. 10/16/21     ALPRAZolam (XANAX) 1 MG tablet Take 0.5 tablets (0.5 mg total) by mouth 3 (three) times daily as needed for 30 days  02/17/22     ALPRAZolam (XANAX) 1 MG tablet Take 1/2 tablet (0.5 mg total) by mouth 3 (three) times daily as needed. 02/20/22     amLODipine (NORVASC) 5 MG tablet Take 1 tablet (5 mg total) by mouth daily. 05/05/22     amLODipine-benazepril (LOTREL) 5-10 MG capsule TAKE 1 CAPSULE BY MOUTH ONCE A DAY 08/20/20 08/20/21  Wenda Low, MD  amLODipine-benazepril (LOTREL) 5-10 MG capsule TAKE 1 CAPSULE BY MOUTH ONCE A DAY AS DIRECTED 07/22/20 07/22/21  Wenda Low, MD  amLODipine-benazepril (LOTREL) 5-10 MG capsule TAKE 1 CAPSULE BY MOUTH ONCE DAILY AS DIRECTED 01/04/20 01/03/21  Wenda Low, MD  amLODipine-benazepril (LOTREL) 5-10 MG capsule Take 1 capsule by mouth daily. 02/20/22     aspirin 81 MG EC tablet Take 81 mg by mouth daily.    [provider]  atorvastatin (LIPITOR) 10 MG tablet TAKE 1 TABLET BY MOUTH ONCE A DAY 06/11/20 06/11/21  Wenda Low, MD  atorvastatin (LIPITOR) 10 MG tablet Take 1 tablet by mouth once a day. 02/13/21     atorvastatin (LIPITOR) 10 MG tablet Take 1 tablet by mouth daily. 02/17/21     augmented betamethasone dipropionate (DIPROLENE-AF) 0.05 % cream Apply 1 application topically 2 (two) times daily. 02/22/20   [provider]  betamethasone dipropionate 0.05 % cream Apply to affected area once a day 02/20/22     busPIRone (BUSPAR) 10 MG tablet Take 10 mg by mouth 3 (three) times daily.    [provider]  busPIRone (BUSPAR) 30 MG tablet Take 2/3 of tablet (107m) by mouth three times daily 02/20/22     ciprofloxacin-hydrocortisone (CIPRO HC) OTIC suspension Place 3 drops into the right ear 2 (two) times daily. 12/19/17   HSharion Balloon FNP  [facility] KIT use as directed 10/29/20   SEdmon Crape RPH  cyclobenzaprine (FLEXERIL) 10 MG tablet Take 10 mg by mouth 3 (three) times daily as needed.    [provider]  cyclobenzaprine (FLEXERIL) 10 MG tablet Take 1 tablet (10 mg total) by mouth 3  (three) times daily as needed for muscle spasms. 02/23/22     ezetimibe (ZETIA) 10 MG tablet Take 1 tablet (10 mg total) by mouth daily. 02/20/22     fluticasone (FLONASE) 50 MCG/ACT nasal spray Place 2 sprays into both nostrils at bedtime. 07/15/21     HYDROcodone-acetaminophen (NORCO/VICODIN) 5-325 MG tablet Take 1 tablet by mouth every 8-12 hours as needed for chronic pain * Do not fill until 11/15/20 11/15/20     HYDROcodone-acetaminophen (NORCO/VICODIN) 5-325 MG tablet Take 1 tablet by mouth every 12 hours as needed for chronic pain ** Do Not fill until 10/14/20 10/14/20     HYDROcodone-acetaminophen (NORCO/VICODIN) 5-325 MG tablet Take 1 tablet  by mouth every 8-12 hours as needed for chronic pain ( DNF UNTIL 01/13/2021) 12/16/20     HYDROcodone-acetaminophen (NORCO/VICODIN) 5-325 MG tablet Take 1 tablet by mouth every 8-12 hours as needed for chronic pain (DNF UNTIL 02/13/2021) 12/16/20     HYDROcodone-acetaminophen (NORCO/VICODIN) 5-325 MG tablet Take 1 tablet by mouth every 12 hours as needed for chronic pain (DNF UNTIL 03/14/2021) 12/16/20     HYDROcodone-acetaminophen (NORCO/VICODIN) 5-325 MG tablet Take 1 tablet by mouth every 12 hours as needed for chronic pain (DNF UNTIL 03/14/2021) 03/11/21     HYDROcodone-acetaminophen (NORCO/VICODIN) 5-325 MG tablet Take one tablet by mouth every 8-12 hours as needed for chronic pain ( DNF UNTIL 05/15/2021) 03/11/21     HYDROcodone-acetaminophen (NORCO/VICODIN) 5-325 MG tablet Take one tablet by mouth every 8-12 hours as needed for chronic pain (DNF UNTIL 04/15/2021) 03/11/21     HYDROcodone-acetaminophen (NORCO/VICODIN) 5-325 MG tablet Take 1 tablet by mouth every 8 to 12 hours as needed for chronic pain  (DNF 07/14/21) 06/11/21     HYDROcodone-acetaminophen (NORCO/VICODIN) 5-325 MG tablet Take 1 tablet by mouth every 8 to 12 hours as needed for chronic pain (DNF 06/14/21) 06/11/21     HYDROcodone-acetaminophen (NORCO/VICODIN) 5-325 MG tablet Take 1 tablet by mouth  every 8 to 12 hours as needed for chronic pain (DNF 08/12/21) 06/11/21     HYDROcodone-acetaminophen (NORCO/VICODIN) 5-325 MG tablet Take 1 tablet by mouth every 8-12 hours as needed for chronic pain (DNF 10/12/21) 10/12/21     HYDROcodone-acetaminophen (NORCO/VICODIN) 5-325 MG tablet Take 1 tablet by mouth every 8-12 hours as needed for chronic pain (DNF 09/13/21) 09/13/21     HYDROcodone-acetaminophen (NORCO/VICODIN) 5-325 MG tablet Take one tablet by mouth every 8-12 hours as needed for chronic pain (DNF 01/13/22) 12/04/21     HYDROcodone-acetaminophen (NORCO/VICODIN) 5-325 MG tablet Take 1 tablet by mouth every 8 - 12 hours as needed for chronic pain (DNF 12/14/21) 12/04/21     HYDROcodone-acetaminophen (NORCO/VICODIN) 5-325 MG tablet Take 1 tablet by mouth every 8-12 hours as needed for chronic pain. 02/23/22     HYDROcodone-acetaminophen (NORCO/VICODIN) 5-325 MG tablet Take 1 tablet by mouth every 8-12 hours as needed for chronic pain. 02/23/22     HYDROcodone-acetaminophen (NORCO/VICODIN) 5-325 MG tablet Take 1 tablet by mouth every 8-12 hours as needed for chronic pain. 02/23/22     HYDROcodone-acetaminophen (VICODIN) 5-500 MG per tablet Take 1 tablet by mouth every 6 (six) hours as needed.    [provider]  lovastatin (MEVACOR) 10 MG tablet TAKE 1 TABLET BY MOUTH AT BEDTIME 05/16/20 05/16/21  Donato Heinz, MD  metoprolol tartrate (LOPRESSOR) 100 MG tablet TAKE 1 TABLET BY MOUTH 2 HOURS PRIOR TO CT 03/12/20 03/12/21  Donato Heinz, MD  neomycin-polymyxin-hydrocortisone (CORTISPORIN) OTIC solution Place 4 drops into the right ear 4 (four) times daily. 12/20/17   Sharion Balloon, FNP  omeprazole (PRILOSEC) 40 MG capsule Take 1 capsule by mouth 2 times a day 30 minutes before a meal 09/29/21     omeprazole (PRILOSEC) 40 MG capsule Take 1 capsule (40 mg total) by mouth every morning 30 minutes before morning meal 02/20/22     pantoprazole (PROTONIX) 40 MG tablet Take 30-60 min before  first meal of the day 05/22/13   Tanda Rockers, MD  pantoprazole (PROTONIX) 40 MG tablet TAKE 1 TABLET BY MOUTH 2 TIMES DAILY 05/16/20 05/16/21  Wenda Low, MD  sucralfate (CARAFATE) 1 g tablet TAKE 1 TABLET BY MOUTH TWICE DAILY ON  AN EMPTY STOMACH (BEFORE FOOD) 04/08/20 04/08/21  Vladimir Crofts, PA-C  tamsulosin (FLOMAX) 0.4 MG CAPS capsule Take 1 capsule by mouth in the morning and at bedtime. 12/13/17   [provider]  tamsulosin (FLOMAX) 0.4 MG CAPS capsule TAKE 2 CAPSULES BY MOUTH AT BEDTIME 10/17/20     tamsulosin (FLOMAX) 0.4 MG CAPS capsule Take 2 capsules (0.8 mg total) by mouth at bedtime. 02/20/22     triamcinolone ointment (KENALOG) 0.1 % Apply to affected area twice a day for 14 days 02/16/22       Current Facility-Administered Medications  Medication Dose Route Frequency Provider Last Rate Last Admin   ciprofloxacin (CIPRO) IVPB 400 mg  400 mg Intravenous Once Carmin Muskrat, MD       metroNIDAZOLE (FLAGYL) IVPB 500 mg  500 mg Intravenous Once Carmin Muskrat, MD       Current Outpatient Medications  Medication Sig Dispense Refill   albuterol (PROVENTIL HFA;VENTOLIN HFA) 108 (90 BASE) MCG/ACT inhaler Inhale 2 puffs into the lungs every 6 (six) hours as needed for wheezing or shortness of breath.     albuterol (VENTOLIN HFA) 108 (90 Base) MCG/ACT inhaler INHALE 2 PUFFS BY MOUTH INTO THE LUNGS EVERY 4 HOURS AS NEEDED 18 g 3   albuterol (VENTOLIN HFA) 108 (90 Base) MCG/ACT inhaler INHALE 2 PUFFS BY MOUTH EVERY 4 HOURS AS NEEDED 18 g 3   albuterol (VENTOLIN HFA) 108 (90 Base) MCG/ACT inhaler INHALE 2 PUFFS BY MOUTH EVERY 4 HOURS AS NEEDED 6.7 g 4   ALPRAZolam (XANAX) 1 MG tablet Take 1 mg by mouth as needed.     ALPRAZolam (XANAX) 1 MG tablet Take 1/2 tablet by mouth three times a day as needed (30 day supply) 45 tablet 3   ALPRAZolam (XANAX) 1 MG tablet Take 1/2 tablet by mouth three times a day as needed 45 tablet 3   ALPRAZolam (XANAX) 1 MG tablet Take 1/2 tablet  by mouth 3 times daily as needed. 45 tablet 3   ALPRAZolam (XANAX) 1 MG tablet Take 0.5 tablets (0.5 mg total) by mouth 3 (three) times daily as needed for 30 days 45 tablet 3   ALPRAZolam (XANAX) 1 MG tablet Take 1/2 tablet (0.5 mg total) by mouth 3 (three) times daily as needed. 45 tablet 3   amLODipine (NORVASC) 5 MG tablet Take 1 tablet (5 mg total) by mouth daily. 90 tablet 5   amLODipine-benazepril (LOTREL) 5-10 MG capsule TAKE 1 CAPSULE BY MOUTH ONCE A DAY 30 capsule 6   amLODipine-benazepril (LOTREL) 5-10 MG capsule TAKE 1 CAPSULE BY MOUTH ONCE A DAY AS DIRECTED 30 capsule 6   amLODipine-benazepril (LOTREL) 5-10 MG capsule TAKE 1 CAPSULE BY MOUTH ONCE DAILY AS DIRECTED 90 capsule 1   amLODipine-benazepril (LOTREL) 5-10 MG capsule Take 1 capsule by mouth daily. 90 capsule 4   aspirin 81 MG EC tablet Take 81 mg by mouth daily.     atorvastatin (LIPITOR) 10 MG tablet TAKE 1 TABLET BY MOUTH ONCE A DAY 30 tablet 6   atorvastatin (LIPITOR) 10 MG tablet Take 1 tablet by mouth once a day. 30 tablet 0   atorvastatin (LIPITOR) 10 MG tablet Take 1 tablet by mouth daily. 90 tablet 4   augmented betamethasone dipropionate (DIPROLENE-AF) 0.05 % cream Apply 1 application topically 2 (two) times daily.     betamethasone dipropionate 0.05 % cream Apply to affected area once a day 30 g 3   busPIRone (BUSPAR) 10 MG tablet Take 10 mg  by mouth 3 (three) times daily.     busPIRone (BUSPAR) 30 MG tablet Take 2/3 of tablet (39m) by mouth three times daily 360 tablet 4   ciprofloxacin-hydrocortisone (CIPRO HC) OTIC suspension Place 3 drops into the right ear 2 (two) times daily. 10 mL 0   COVID-19 At Home Antigen Test ([facility] COVID-19 HOME TEST) KIT use as directed 4 each 0   cyclobenzaprine (FLEXERIL) 10 MG tablet Take 10 mg by mouth 3 (three) times daily as needed.     cyclobenzaprine (FLEXERIL) 10 MG tablet Take 1 tablet (10 mg total) by mouth 3 (three) times daily as needed for muscle spasms. 90 tablet 2    ezetimibe (ZETIA) 10 MG tablet Take 1 tablet (10 mg total) by mouth daily. 90 tablet 5   fluticasone (FLONASE) 50 MCG/ACT nasal spray Place 2 sprays into both nostrils at bedtime. 16 g 11   HYDROcodone-acetaminophen (NORCO/VICODIN) 5-325 MG tablet Take 1 tablet by mouth every 8-12 hours as needed for chronic pain * Do not fill until 11/15/20 60 tablet 0   HYDROcodone-acetaminophen (NORCO/VICODIN) 5-325 MG tablet Take 1 tablet by mouth every 12 hours as needed for chronic pain ** Do Not fill until 10/14/20 60 tablet 0   HYDROcodone-acetaminophen (NORCO/VICODIN) 5-325 MG tablet Take 1 tablet by mouth every 8-12 hours as needed for chronic pain ( DNF UNTIL 01/13/2021) 60 tablet 0   HYDROcodone-acetaminophen (NORCO/VICODIN) 5-325 MG tablet Take 1 tablet by mouth every 8-12 hours as needed for chronic pain (DNF UNTIL 02/13/2021) 60 tablet 0   HYDROcodone-acetaminophen (NORCO/VICODIN) 5-325 MG tablet Take 1 tablet by mouth every 12 hours as needed for chronic pain (DNF UNTIL 03/14/2021) 60 tablet 0   HYDROcodone-acetaminophen (NORCO/VICODIN) 5-325 MG tablet Take 1 tablet by mouth every 12 hours as needed for chronic pain (DNF UNTIL 03/14/2021) 60 tablet 0   HYDROcodone-acetaminophen (NORCO/VICODIN) 5-325 MG tablet Take one tablet by mouth every 8-12 hours as needed for chronic pain ( DNF UNTIL 05/15/2021) 60 tablet 0   HYDROcodone-acetaminophen (NORCO/VICODIN) 5-325 MG tablet Take one tablet by mouth every 8-12 hours as needed for chronic pain (DNF UNTIL 04/15/2021) 60 tablet 0   HYDROcodone-acetaminophen (NORCO/VICODIN) 5-325 MG tablet Take 1 tablet by mouth every 8 to 12 hours as needed for chronic pain  (DNF 07/14/21) 60 tablet 0   HYDROcodone-acetaminophen (NORCO/VICODIN) 5-325 MG tablet Take 1 tablet by mouth every 8 to 12 hours as needed for chronic pain (DNF 06/14/21) 60 tablet 0   HYDROcodone-acetaminophen (NORCO/VICODIN) 5-325 MG tablet Take 1 tablet by mouth every 8 to 12 hours as needed for chronic  pain (DNF 08/12/21) 60 tablet 0   HYDROcodone-acetaminophen (NORCO/VICODIN) 5-325 MG tablet Take 1 tablet by mouth every 8-12 hours as needed for chronic pain (DNF 10/12/21) 60 tablet 0   HYDROcodone-acetaminophen (NORCO/VICODIN) 5-325 MG tablet Take 1 tablet by mouth every 8-12 hours as needed for chronic pain (DNF 09/13/21) 60 tablet 0   HYDROcodone-acetaminophen (NORCO/VICODIN) 5-325 MG tablet Take one tablet by mouth every 8-12 hours as needed for chronic pain (DNF 01/13/22) 60 tablet 0   HYDROcodone-acetaminophen (NORCO/VICODIN) 5-325 MG tablet Take 1 tablet by mouth every 8 - 12 hours as needed for chronic pain (DNF 12/14/21) 60 tablet 0   HYDROcodone-acetaminophen (NORCO/VICODIN) 5-325 MG tablet Take 1 tablet by mouth every 8-12 hours as needed for chronic pain. 60 tablet 0   HYDROcodone-acetaminophen (NORCO/VICODIN) 5-325 MG tablet Take 1 tablet by mouth every 8-12 hours as needed for chronic pain. 60 tablet 0  HYDROcodone-acetaminophen (NORCO/VICODIN) 5-325 MG tablet Take 1 tablet by mouth every 8-12 hours as needed for chronic pain. 60 tablet 0   HYDROcodone-acetaminophen (VICODIN) 5-500 MG per tablet Take 1 tablet by mouth every 6 (six) hours as needed.     lovastatin (MEVACOR) 10 MG tablet TAKE 1 TABLET BY MOUTH AT BEDTIME 30 tablet 6   metoprolol tartrate (LOPRESSOR) 100 MG tablet TAKE 1 TABLET BY MOUTH 2 HOURS PRIOR TO CT 1 tablet 0   neomycin-polymyxin-hydrocortisone (CORTISPORIN) OTIC solution Place 4 drops into the right ear 4 (four) times daily. 10 mL 0   omeprazole (PRILOSEC) 40 MG capsule Take 1 capsule by mouth 2 times a day 30 minutes before a meal 180 capsule 2   omeprazole (PRILOSEC) 40 MG capsule Take 1 capsule (40 mg total) by mouth every morning 30 minutes before morning meal 90 capsule 3   pantoprazole (PROTONIX) 40 MG tablet Take 30-60 min before first meal of the day     pantoprazole (PROTONIX) 40 MG tablet TAKE 1 TABLET BY MOUTH 2 TIMES DAILY 180 tablet 1   sucralfate  (CARAFATE) 1 g tablet TAKE 1 TABLET BY MOUTH TWICE DAILY ON AN EMPTY STOMACH (BEFORE FOOD) 60 tablet 1   tamsulosin (FLOMAX) 0.4 MG CAPS capsule Take 1 capsule by mouth in the morning and at bedtime.     tamsulosin (FLOMAX) 0.4 MG CAPS capsule TAKE 2 CAPSULES BY MOUTH AT BEDTIME 180 capsule 3   tamsulosin (FLOMAX) 0.4 MG CAPS capsule Take 2 capsules (0.8 mg total) by mouth at bedtime. 180 capsule 4   triamcinolone ointment (KENALOG) 0.1 % Apply to affected area twice a day for 14 days 30 g 1    Allergies as of 05/12/2022 - Review Complete 05/12/2022  Allergen Reaction Noted   Voltaren [diclofenac sodium] Rash 08/10/2011   Crestor [rosuvastatin]  05/22/2013   Lyrica [pregabalin]  05/22/2013   Simvastatin  05/22/2013    Family History  Problem Relation Age of Onset   Tuberculosis Father    Heart failure Mother    COPD Mother     Social History   Socioeconomic History   Marital status: Married    Spouse name: Not on file   Number of children: Not on file   Years of education: Not on file   Highest education level: Not on file  Occupational History   Not on file  Tobacco Use   Smoking status: Former    Packs/day: 3.00    Years: 30.00    Total pack years: 90.00    Types: Cigarettes    Quit date: 08/11/2006    Years since quitting: 15.7   Smokeless tobacco: Never  Substance and Sexual Activity   Alcohol use: Yes    Alcohol/week: 4.0 standard drinks of alcohol    Types: 4 Cans of beer per week   Drug use: No   Sexual activity: Not on file  Other Topics Concern   Not on file  Social History Narrative   Not on file   Social Determinants of Health   Financial Resource Strain: Not on file  Food Insecurity: Not on file  Transportation Needs: Not on file  Physical Activity: Not on file  Stress: Not on file  Social Connections: Not on file  Intimate Partner Violence: Not on file    Review of Systems   Gen: Denies any fever, chills, loss of appetite, change in weight  or weight loss CV: Denies chest pain, heart palpitations, syncope, edema  Resp: Denies  shortness of breath with rest, cough, wheezing, coughing up blood, and pleurisy. GI: denies melena, nausea, vomiting, constipation, dysphagia, odyonophagia, early satiety or weight loss. +rectal bleeding +diarrhea +abdominal pain GU : Denies urinary burning, blood in urine, urinary frequency, and urinary incontinence. MS: Denies joint pain, limitation of movement, swelling, cramps, and atrophy.  Derm: Denies rash, itching, dry skin, hives. Psych: Denies depression, anxiety, memory loss, hallucinations, and confusion. Heme: Denies bruising or bleeding Neuro:  Denies any headaches, dizziness, paresthesias, shaking  Physical Exam   Vital Signs in last 24 hours: Temp:  [98.2 F (36.8 C)-98.8 F (37.1 C)] 98.2 F (36.8 C) (12/12 1316) Pulse Rate:  [68-75] 68 (12/12 1316) Resp:  [16-18] 18 (12/12 1316) BP: (129-141)/(91-98) 141/91 (12/12 1316) SpO2:  [96 %-99 %] 96 % (12/12 1316) Weight:  [90.7 kg] 90.7 kg (12/12 1137)   General:   Alert,  Well-developed, well-nourished, pleasant and cooperative in NAD Head:  Normocephalic and atraumatic. Eyes:  Sclera clear, no icterus.   Conjunctiva pink. Ears:  Normal auditory acuity. Mouth:  No deformity or lesions, dentition normal. Lungs:  Clear throughout to auscultation.   No wheezes, crackles, or rhonchi. No acute distress. Heart:  Regular rate and rhythm; no murmurs, clicks, rubs,  or gallops. Abdomen:  Soft, and nondistended. Mild TTP around umbilicus. No masses, hepatosplenomegaly or hernias noted. Normal bowel sounds, without guarding, and without rebound.   Msk:  Symmetrical without gross deformities. Normal posture. Extremities:  Without clubbing or edema. Neurologic:  Alert and  oriented x4. Skin:  Intact without significant lesions or rashes. Psych:  Alert and cooperative. Normal mood and affect.  Labs/Studies   Recent Labs Recent Labs     05/12/22 1203  WBC 7.9  HGB 16.3  HCT 47.8  PLT 200   BMET Recent Labs    05/12/22 1203  NA 140  K 4.2  CL 106  CO2 24  GLUCOSE 96  BUN 13  CREATININE 1.11  CALCIUM 9.2   LFT Recent Labs    05/12/22 1203  PROT 7.2  ALBUMIN 4.1  AST 20  ALT 30  ALKPHOS 55  BILITOT 0.7    Radiology/Studies CT Abdomen Pelvis W Contrast  Result Date: 05/12/2022 CLINICAL DATA:  Diverticulitis. Complication suspected. Complains of bright red blood that started last night. EXAM: CT ABDOMEN AND PELVIS WITH CONTRAST TECHNIQUE: Multidetector CT imaging of the abdomen and pelvis was performed using the standard protocol following bolus administration of intravenous contrast. RADIATION DOSE REDUCTION: This exam was performed according to the departmental dose-optimization program which includes automated exposure control, adjustment of the mA and/or kV according to patient size and/or use of iterative reconstruction technique. CONTRAST:  132m OMNIPAQUE IOHEXOL 300 MG/ML  SOLN COMPARISON:  CT examination dated December 08, 2017. FINDINGS: Lower chest: No acute abnormality. Hepatobiliary: No focal liver abnormality is seen. No gallstones, gallbladder wall thickening, or biliary dilatation. Pancreas: Mild generalized pancreatic atrophy prominent in the head and neck. No pancreatic ductal dilatation or surrounding inflammatory changes. Spleen: Normal in size without focal abnormality. Adrenals/Urinary Tract: Adrenal glands are unremarkable. Kidneys are normal, without renal calculi, focal lesion, or hydronephrosis. Bladder is unremarkable. Stomach/Bowel: Small hiatal hernia. Stomach and small bowel loops are normal. Normal appendix. There is marked thickening of the transverse and descending colonic wall suggesting severe colitis. Mild adjacent fat stranding. Sigmoid colon and rectum are unremarkable. No evidence of acute diverticulitis. Vascular/Lymphatic: Mild aortic atherosclerotic calcifications. No  lymphadenopathy. Reproductive: Prostate is unremarkable. Other: Small fat containing left inguinal  hernia. No abdominopelvic ascites. Musculoskeletal: Degenerate disc disease of the lumbar spine. No acute osseous abnormality. IMPRESSION: 1. Marked thickening of the transverse and descending colonic wall with mild adjacent fat stranding consistent with severe colitis. This is likely secondary to infectious/inflammatory process. 2. Small hiatal hernia. 3. Normal appendix. 4. No evidence of acute diverticulitis. 5. Small fat containing left inguinal hernia. 6. Degenerate disc disease of the lumbar spine. 7. Mild aortic atherosclerosis. Electronically Signed   By: Keane Police D.O.   On: 05/12/2022 14:20     Assessment   [name] is a 61 y.o. year old male medical history of anxiety, arthritis, dyslipidemia, fatty liver, GERD, hiatal hernia, peripheral neuropathy, sleep apnea who presented to the ED with abdominal pain, diarrhea and rectal bleeding. GI consulted  Colitis: abdominal cramping over the past few days with diarrhea and rectal bleeding last night.  Symptoms do appear to be improving today. CT with findings of colitis in transverse and descending colon.  This could be infectious or possibly ischemic colitis given the amount of bleeding that patient reported as well as some recent episodes of hypotension. Frequent NSAID use could also have contributed. Fortunately bleeding has slowed down to a minimum at this time and diarrhea has slowed as well.  Will check stool studies to rule out infectious etiology of most common bacteria.  May consider CTA if rectal bleeding becomes heavier or if it is accompanied by severe abdominal pain.  For now we will continue with metronidazole and ciprofloxacin antibiotic coverage and consider follow-up colonoscopy as an outpatient in 6 to 8 weeks.    Plan / Recommendations   GI Pathogen panel C diff Continue flagyl and Cipro Colonoscopy in 6-8 weeks as  outpatient Trend h&h and for overt GI bleeding  6. Clear liquid diet 7. CTA if rectal bleeding increases or accompanied by severe abdominal pain    05/12/2022, 2:32 PM  Naika Noto L. Alver Sorrow, MSN, APRN, AGNP-C Adult-Gerontology Nurse Practitioner [facility] at [facility]

## 2022-05-12 NOTE — Progress Notes (Signed)
Patient does not use a CPAP at home.  Patient uses breath strips and wife had dropped some by for him to use here at the hospital.

## 2022-05-12 NOTE — ED Triage Notes (Signed)
Pt c/o bright red blood that started last night; pt states he has had abdominal pain for the last week that started out on his right side abdomen but has moved to lower abdomen  Pt states he has a BM every 15-20 minutes with approximately 3 tablespoons of bright red blood

## 2022-05-13 DIAGNOSIS — K5289 Other specified noninfective gastroenteritis and colitis: Secondary | ICD-10-CM | POA: Diagnosis not present

## 2022-05-13 DIAGNOSIS — Z87891 Personal history of nicotine dependence: Secondary | ICD-10-CM | POA: Diagnosis not present

## 2022-05-13 DIAGNOSIS — K625 Hemorrhage of anus and rectum: Secondary | ICD-10-CM | POA: Diagnosis not present

## 2022-05-13 DIAGNOSIS — Z79899 Other long term (current) drug therapy: Secondary | ICD-10-CM | POA: Diagnosis not present

## 2022-05-13 LAB — BASIC METABOLIC PANEL
Anion gap: 6 (ref 5–15)
BUN: 12 mg/dL (ref 8–23)
CO2: 24 mmol/L (ref 22–32)
Calcium: 8.4 mg/dL — ABNORMAL LOW (ref 8.9–10.3)
Chloride: 108 mmol/L (ref 98–111)
Creatinine, Ser: 1.15 mg/dL (ref 0.61–1.24)
GFR, Estimated: >60 mL/min (ref >60)
Glucose, Bld: 106 mg/dL — ABNORMAL HIGH (ref 70–99)
Potassium: 3.9 mmol/L (ref 3.5–5.1)
Sodium: 138 mmol/L (ref 135–145)

## 2022-05-13 LAB — CBC
HCT: 44.2 % (ref 39.0–52.0)
Hemoglobin: 15.3 g/dL (ref 13.0–17.0)
MCH: 32.2 pg (ref 26.0–34.0)
MCHC: 34.6 g/dL (ref 30.0–36.0)
MCV: 93.1 fL (ref 80.0–100.0)
Platelets: 180 10*3/uL (ref 150–400)
RBC: 4.75 MIL/uL (ref 4.22–5.81)
RDW: 12.8 % (ref 11.5–15.5)
WBC: 6.3 10*3/uL (ref 4.0–10.5)
nRBC: 0 % (ref 0.0–0.2)

## 2022-05-13 MED ORDER — TAMSULOSIN HCL 0.4 MG PO CAPS
0.4000 mg | ORAL_CAPSULE | Freq: Two times a day (BID) | ORAL | Status: DC
  Administered 2022-05-13: 0.4 mg via ORAL
  Filled 2022-05-13: qty 1

## 2022-05-13 MED ORDER — PANTOPRAZOLE SODIUM 40 MG PO TBEC: 40.0000 mg | DELAYED_RELEASE_TABLET | Freq: Every day | ORAL | 0 refills | Status: AC

## 2022-05-13 MED ORDER — ALPRAZOLAM 0.5 MG PO TABS: 0.5000 mg | ORAL_TABLET | Freq: Three times a day (TID) | ORAL | Status: DC | PRN

## 2022-05-13 MED ORDER — EZETIMIBE 10 MG PO TABS
10.0000 mg | ORAL_TABLET | Freq: Every day | ORAL | Status: DC
  Administered 2022-05-13: 10 mg via ORAL
  Filled 2022-05-13: qty 1

## 2022-05-13 MED ORDER — HYDROCODONE-ACETAMINOPHEN 5-325 MG PO TABS
1.0000 | ORAL_TABLET | Freq: Four times a day (QID) | ORAL | Status: DC | PRN
  Administered 2022-05-13: 1 via ORAL
  Filled 2022-05-13: qty 1

## 2022-05-13 MED ORDER — METRONIDAZOLE 500 MG PO TABS: 500.0000 mg | ORAL_TABLET | Freq: Two times a day (BID) | ORAL | 0 refills | Status: AC

## 2022-05-13 MED ORDER — BUSPIRONE HCL 5 MG PO TABS
20.0000 mg | ORAL_TABLET | Freq: Three times a day (TID) | ORAL | Status: DC
  Administered 2022-05-13 (×2): 20 mg via ORAL
  Filled 2022-05-13 (×2): qty 4

## 2022-05-13 MED ORDER — CYCLOBENZAPRINE HCL 10 MG PO TABS
10.0000 mg | ORAL_TABLET | Freq: Three times a day (TID) | ORAL | Status: DC | PRN
  Administered 2022-05-13: 10 mg via ORAL
  Filled 2022-05-13: qty 1

## 2022-05-13 MED ORDER — CIPROFLOXACIN HCL 500 MG PO TABS: 500.0000 mg | ORAL_TABLET | Freq: Two times a day (BID) | ORAL | 0 refills | Status: AC

## 2022-05-13 MED ORDER — ALBUTEROL SULFATE (2.5 MG/3ML) 0.083% IN NEBU: 3.0000 mL | INHALATION_SOLUTION | RESPIRATORY_TRACT | Status: DC | PRN

## 2022-05-13 MED ORDER — AMLODIPINE BESYLATE 5 MG PO TABS
5.0000 mg | ORAL_TABLET | Freq: Every day | ORAL | Status: DC
  Administered 2022-05-13: 5 mg via ORAL
  Filled 2022-05-13: qty 1

## 2022-05-13 NOTE — TOC Progression Note (Signed)
Transition of Care [facility]) - Progression Note    Patient Details  Name: [name] MRN: [id number] Date of Birth: [birth year]  Transition of Care [facility]) CM/SW Contact  Salome Arnt, [place] Phone Number: 05/13/2022, 9:56 AM  Clinical Narrative:  Transition of Care (TOC) Screening Note   Patient Details  Name: [name] Date of Birth: February 19, 1961   Transition of Care [facility]) CM/SW Contact:    Salome Arnt, [place] Phone Number: 05/13/2022, 9:56 AM    Transition of Care Department [facility]) has reviewed patient and no TOC needs have been identified at this time. We will continue to monitor patient advancement through interdisciplinary progression rounds. If new patient transition needs arise, please place a TOC consult.             Expected Discharge Plan and Services                                                 Social Determinants of Health (SDOH) Interventions Housing Interventions: Intervention Not Indicated  Readmission Risk Interventions     No data to display

## 2022-05-13 NOTE — Discharge Summary (Signed)
Physician Discharge Summary  [name] ZDG:[id number] DOB: 04-02-1961 DOA: 05/12/2022  PCP: Wenda Low, MD  Admit date: 05/12/2022  Discharge date: 05/13/2022  Admitted From:Home  Disposition:  Home  Recommendations for Outpatient Follow-up:  Follow up with PCP in 1-2 weeks Follow up with GI which will be scheduled for colonoscopy Continue on Cipro and Flagyl as prescribed for 5 days Avoid NSAIDs Continue Protonix daily Continue on other home medications as prior  Home Health:None  Equipment/Devices:None  Discharge Condition:Stable  CODE STATUS: Full  Diet recommendation: Heart Healthy  Brief/Interim Summary: [name] is a 61 year old gentleman with GERD, fatty liver, OSA, anxiety, arthritis, hiatal hernia and peripheral neuropathy presented to the emergency department complaining of bright red rectal blood that started the night prior to admission.  He was admitted for severe colitis and is currently on antibiotic therapy with gradual improvement noted. He continues to have some rectal bleeding with wiping, but hemoglobin remains stable. He has been seen by GI with recommendations to remain on cipro and flagyl as noted above for likely gastroenteritis and will have follow up for colonoscopy early next year. Remain on daily Protonix as prescribed.  Discharge Diagnoses:  Principal Problem:   Bright red rectal bleeding Active Problems:   Colitis presumed infectious   Sleep apnea   Peripheral neuropathy   H/O hiatal hernia   GERD (gastroesophageal reflux disease)   Fatty liver   Dyslipidemia   DJD (degenerative joint disease), cervical   Arthritis   Anxiety  Principal discharge diagnosis: Severe colitis with rectal bleeding likely due to food borne illness/gastroenteritis.  Discharge Instructions  Discharge Instructions     Diet - low sodium heart healthy   Complete by: As directed    Increase activity slowly   Complete by: As directed        Allergies as of 05/13/2022       Reactions   Voltaren [diclofenac Sodium] Rash   Crestor [rosuvastatin]    myalgia   Lyrica [pregabalin]    MS changes   Simvastatin    myalgia        Medication List     STOP taking these medications    amLODipine-benazepril 5-10 MG capsule Commonly known as: LOTREL   aspirin EC 81 MG tablet   ibuprofen 200 MG tablet Commonly known as: ADVIL   lovastatin 10 MG tablet Commonly known as: MEVACOR   metoprolol tartrate 100 MG tablet Commonly known as: LOPRESSOR       TAKE these medications    albuterol 108 (90 Base) MCG/ACT inhaler Commonly known as: VENTOLIN HFA INHALE 2 PUFFS BY MOUTH EVERY 4 HOURS AS NEEDED   ALPRAZolam 1 MG tablet Commonly known as: XANAX Take 1/2 tablet (0.5 mg total) by mouth 3 (three) times daily as needed. What changed: Another medication with the same name was removed. Continue taking this medication, and follow the directions you see here.   amLODipine 5 MG tablet Commonly known as: NORVASC Take 1 tablet (5 mg total) by mouth daily.   betamethasone dipropionate 0.05 % cream Apply to affected area once a day   busPIRone 30 MG tablet Commonly known as: BUSPAR Take 2/3 of tablet ('20mg'$ ) by mouth three times daily   ciprofloxacin 500 MG tablet Commonly known as: Cipro Take 1 tablet (500 mg total) by mouth 2 (two) times daily for 5 days.   cyclobenzaprine 10 MG tablet Commonly known as: FLEXERIL Take 1 tablet (10 mg total) by mouth 3 (three) times daily as  needed for muscle spasms.   ezetimibe 10 MG tablet Commonly known as: ZETIA Take 1 tablet (10 mg total) by mouth daily.   fluticasone 50 MCG/ACT nasal spray Commonly known as: FLONASE Place 2 sprays into both nostrils at bedtime.   HYDROcodone-acetaminophen 5-325 MG tablet Commonly known as: NORCO/VICODIN Take 1 tablet by mouth every 8-12 hours as needed for chronic pain. What changed: Another medication with the same name was removed.  Continue taking this medication, and follow the directions you see here.   metroNIDAZOLE 500 MG tablet Commonly known as: Flagyl Take 1 tablet (500 mg total) by mouth 2 (two) times daily for 5 days.   omeprazole 40 MG capsule Commonly known as: PRILOSEC Take 1 capsule by mouth 2 times a day 30 minutes before a meal What changed: Another medication with the same name was removed. Continue taking this medication, and follow the directions you see here.   pantoprazole 40 MG tablet Commonly known as: PROTONIX Take 1 tablet (40 mg total) by mouth daily. Start taking on: May 14, 2022 What changed:  how much to take how to take this when to take this additional instructions   tamsulosin 0.4 MG Caps capsule Commonly known as: FLOMAX Take 1 capsule by mouth in the morning and at bedtime. What changed: Another medication with the same name was removed. Continue taking this medication, and follow the directions you see here.   triamcinolone ointment 0.1 % Commonly known as: KENALOG Apply to affected area twice a day for 14 days        Follow-up Information     Wenda Low, MD. Schedule an appointment as soon as possible for a visit in 1 week(s).   Specialty: Internal Medicine Contact information: [address], [address] [place] [address] [phone]         [place]. Go to.   Contact information: [place] [address] [phone]               Allergies  Allergen Reactions   Voltaren [Diclofenac Sodium] Rash   Crestor [Rosuvastatin]     myalgia   Lyrica [Pregabalin]     MS changes   Simvastatin     myalgia    Consultations: GI   Procedures/Studies: CT Abdomen Pelvis W Contrast  Result Date: 05/12/2022 CLINICAL DATA:  Diverticulitis. Complication suspected. Complains of bright red blood that started last night. EXAM: CT ABDOMEN AND PELVIS WITH CONTRAST TECHNIQUE:  Multidetector CT imaging of the abdomen and pelvis was performed using the standard protocol following bolus administration of intravenous contrast. RADIATION DOSE REDUCTION: This exam was performed according to the departmental dose-optimization program which includes automated exposure control, adjustment of the mA and/or kV according to patient size and/or use of iterative reconstruction technique. CONTRAST:  115m OMNIPAQUE IOHEXOL 300 MG/ML  SOLN COMPARISON:  CT examination dated December 08, 2017. FINDINGS: Lower chest: No acute abnormality. Hepatobiliary: No focal liver abnormality is seen. No gallstones, gallbladder wall thickening, or biliary dilatation. Pancreas: Mild generalized pancreatic atrophy prominent in the head and neck. No pancreatic ductal dilatation or surrounding inflammatory changes. Spleen: Normal in size without focal abnormality. Adrenals/Urinary Tract: Adrenal glands are unremarkable. Kidneys are normal, without renal calculi, focal lesion, or hydronephrosis. Bladder is unremarkable. Stomach/Bowel: Small hiatal hernia. Stomach and small bowel loops are normal. Normal appendix. There is marked thickening of the transverse and descending colonic wall suggesting severe colitis. Mild adjacent fat stranding. Sigmoid colon and rectum are unremarkable. No  evidence of acute diverticulitis. Vascular/Lymphatic: Mild aortic atherosclerotic calcifications. No lymphadenopathy. Reproductive: Prostate is unremarkable. Other: Small fat containing left inguinal hernia. No abdominopelvic ascites. Musculoskeletal: Degenerate disc disease of the lumbar spine. No acute osseous abnormality. IMPRESSION: 1. Marked thickening of the transverse and descending colonic wall with mild adjacent fat stranding consistent with severe colitis. This is likely secondary to infectious/inflammatory process. 2. Small hiatal hernia. 3. Normal appendix. 4. No evidence of acute diverticulitis. 5. Small fat containing left inguinal  hernia. 6. Degenerate disc disease of the lumbar spine. 7. Mild aortic atherosclerosis. Electronically Signed   By: Keane Police D.O.   On: 05/12/2022 14:20     Discharge Exam: Vitals:   05/13/22 1100 05/13/22 1351  BP: (!) 133/96 (!) 129/93  Pulse: 66 79  Resp: 19 18  Temp: 98.4 F (36.9 C) 98.9 F (37.2 C)  SpO2: 98% 100%   Vitals:   05/13/22 0356 05/13/22 0957 05/13/22 1100 05/13/22 1351  BP: (!) 140/91 (!) 147/91 (!) 133/96 (!) 129/93  Pulse: 65 68 66 79  Resp: '20 19 19 18  '$ Temp: 98 F (36.7 C)  98.4 F (36.9 C) 98.9 F (37.2 C)  TempSrc: Oral     SpO2: 96% 98% 98% 100%  Weight:      Height:        General: Pt is alert, awake, not in acute distress Cardiovascular: RRR, S1/S2 +, no rubs, no gallops Respiratory: CTA bilaterally, no wheezing, no rhonchi Abdominal: Soft, NT, ND, bowel sounds + Extremities: no edema, no cyanosis    The results of significant diagnostics from this hospitalization (including imaging, microbiology, ancillary and laboratory) are listed below for reference.     Microbiology: No results found for this or any previous visit (from the past 240 hour(s)).   Labs: BNP (last 3 results) No results for input(s): "BNP" in the last 8760 hours. Basic Metabolic Panel: Recent Labs  Lab 05/12/22 1203 05/13/22 0430  NA 140 138  K 4.2 3.9  CL 106 108  CO2 24 24  GLUCOSE 96 106*  BUN 13 12  CREATININE 1.11 1.15  CALCIUM 9.2 8.4*   Liver Function Tests: Recent Labs  Lab 05/12/22 1203  AST 20  ALT 30  ALKPHOS 55  BILITOT 0.7  PROT 7.2  ALBUMIN 4.1   No results for input(s): "LIPASE", "AMYLASE" in the last 168 hours. No results for input(s): "AMMONIA" in the last 168 hours. CBC: Recent Labs  Lab 05/12/22 1203 05/13/22 0430  WBC 7.9 6.3  HGB 16.3 15.3  HCT 47.8 44.2  MCV 91.9 93.1  PLT 200 180   Cardiac Enzymes: No results for input(s): "CKTOTAL", "CKMB", "CKMBINDEX", "TROPONINI" in the last 168 hours. BNP: Invalid input(s):  "POCBNP" CBG: No results for input(s): "GLUCAP" in the last 168 hours. D-Dimer No results for input(s): "DDIMER" in the last 72 hours. Hgb A1c No results for input(s): "HGBA1C" in the last 72 hours. Lipid Profile No results for input(s): "CHOL", "HDL", "LDLCALC", "TRIG", "CHOLHDL", "LDLDIRECT" in the last 72 hours. Thyroid function studies No results for input(s): "TSH", "T4TOTAL", "T3FREE", "THYROIDAB" in the last 72 hours.  Invalid input(s): "FREET3" Anemia work up No results for input(s): "VITAMINB12", "FOLATE", "FERRITIN", "TIBC", "IRON", "RETICCTPCT" in the last 72 hours. Urinalysis    Component Value Date/Time   COLORURINE GREEN BIOCHEMICALS MAY BE AFFECTED BY COLOR (A) 06/29/2007 1218   APPEARANCEUR CLEAR 06/29/2007 1218   LABSPEC 1.010 06/29/2007 1218   PHURINE 5.5 06/29/2007 [place]  06/29/2007 1218   HGBUR LARGE (A) 06/29/2007 [place] 06/29/2007 [place] 06/29/2007 1218   PROTEINUR NEGATIVE 06/29/2007 1218   UROBILINOGEN 0.2 06/29/2007 1218   NITRITE NEGATIVE 06/29/2007 1218   LEUKOCYTESUR NEGATIVE 06/29/2007 1218   Sepsis Labs Recent Labs  Lab 05/12/22 1203 05/13/22 0430  WBC 7.9 6.3   Microbiology No results found for this or any previous visit (from the past 240 hour(s)).   Time coordinating discharge: 35 minutes  SIGNED:   Rodena Goldmann, DO [facility] 05/13/2022, 5:22 PM  If 7PM-7AM, please contact night-coverage [email or web address]

## 2022-05-13 NOTE — Progress Notes (Signed)
PROGRESS NOTE    [name]  TFT:[id number] DOB: 15-Feb-1961 DOA: 05/12/2022 PCP: Wenda Low, MD   Brief Narrative:    [name] is a 61 year old gentleman with GERD, fatty liver, OSA, anxiety, arthritis, hiatal hernia and peripheral neuropathy presented to the emergency department complaining of bright red rectal blood that started the night prior to admission.  He was admitted for severe colitis and is currently on antibiotic therapy with gradual improvement noted.  He continues to have ongoing issues with rectal bleeding.  Assessment & Plan:   Principal Problem:   Bright red rectal bleeding Active Problems:   Colitis presumed infectious   Sleep apnea   Peripheral neuropathy   H/O hiatal hernia   GERD (gastroesophageal reflux disease)   Fatty liver   Dyslipidemia   DJD (degenerative joint disease), cervical   Arthritis   Anxiety  Assessment and Plan:   Severe Colitis with rectal bleeding - involving transverse and descending colon - recent rare bloody steak consumption concerning for infectious cause - continue IV ciprofloxacin and metronidazole -Stool GI panel and C. difficile pending - clear liquid diet advance to soft - pain management as needed - nausea management as needed - appreciate GI service consultation and recommendations - CBC and BMP in AM    OSA - will offer nightly CPAP while inpatient   GERD - protonix ordered for GI protection   Dislipidemia    OA with DJD  - acetaminophen and other analgesics as needed -Resume home pain medications for neck pain    DVT prophylaxis: SCDs Code Status: Full Family Communication: None at bedside Disposition Plan:  Status is: Observation The patient will require care spanning > 2 midnights and should be moved to inpatient because: Need for close monitoring and IV medications.   Consultants:  GI  Procedures:  None  Antimicrobials:  Anti-infectives (From admission, onward)    Start      Dose/Rate Route Frequency Ordered Stop   05/12/22 2200  ciprofloxacin (CIPRO) IVPB 400 mg        400 mg 200 mL/hr over 60 Minutes Intravenous Every 12 hours 05/12/22 1506     05/12/22 2200  metroNIDAZOLE (FLAGYL) IVPB 500 mg        500 mg 100 mL/hr over 60 Minutes Intravenous Every 12 hours 05/12/22 1506     05/12/22 1430  ciprofloxacin (CIPRO) IVPB 400 mg        400 mg 200 mL/hr over 60 Minutes Intravenous  Once 05/12/22 1424 05/12/22 1541   05/12/22 1430  metroNIDAZOLE (FLAGYL) IVPB 500 mg        500 mg 100 mL/hr over 60 Minutes Intravenous  Once 05/12/22 1424 05/12/22 1541       Subjective: Patient seen and evaluated today with no further abdominal pain, however he is still having bright red blood per rectum with blood clots in his bowel movements.  He denies any significant diarrhea, nausea, or vomiting.  Complaining more of neck pain this morning and stiffness.  Objective: Vitals:   05/13/22 0356 05/13/22 0957 05/13/22 1100 05/13/22 1351  BP: (!) 140/91 (!) 147/91 (!) 133/96 (!) 129/93  Pulse: 65 68 66 79  Resp: '20 19 19 18  '$ Temp: 98 F (36.7 C)  98.4 F (36.9 C) 98.9 F (37.2 C)  TempSrc: Oral     SpO2: 96% 98% 98% 100%  Weight:      Height:        Intake/Output Summary (Last 24 hours) at 05/13/2022 1359  Last data filed at 05/13/2022 0401 Gross per 24 hour  Intake 2204.01 ml  Output --  Net 2204.01 ml   Filed Weights   05/12/22 1137  Weight: 90.7 kg    Examination:  General exam: Appears calm and comfortable  Respiratory system: Clear to auscultation. Respiratory effort normal. Cardiovascular system: S1 & S2 heard, RRR.  Gastrointestinal system: Abdomen is soft Central nervous system: Alert and awake Extremities: No edema Skin: No significant lesions noted Psychiatry: Flat affect.    Data Reviewed: I have personally reviewed following labs and imaging studies  CBC: Recent Labs  Lab 05/12/22 1203 05/13/22 0430  WBC 7.9 6.3  HGB 16.3 15.3  HCT  47.8 44.2  MCV 91.9 93.1  PLT 200 235   Basic Metabolic Panel: Recent Labs  Lab 05/12/22 1203 05/13/22 0430  NA 140 138  K 4.2 3.9  CL 106 108  CO2 24 24  GLUCOSE 96 106*  BUN 13 12  CREATININE 1.11 1.15  CALCIUM 9.2 8.4*   GFR: Estimated Creatinine Clearance: 76.8 mL/min (by C-G formula based on SCr of 1.15 mg/dL). Liver Function Tests: Recent Labs  Lab 05/12/22 1203  AST 20  ALT 30  ALKPHOS 55  BILITOT 0.7  PROT 7.2  ALBUMIN 4.1   No results for input(s): "LIPASE", "AMYLASE" in the last 168 hours. No results for input(s): "AMMONIA" in the last 168 hours. Coagulation Profile: No results for input(s): "INR", "PROTIME" in the last 168 hours. Cardiac Enzymes: No results for input(s): "CKTOTAL", "CKMB", "CKMBINDEX", "TROPONINI" in the last 168 hours. BNP (last 3 results) No results for input(s): "PROBNP" in the last 8760 hours. HbA1C: No results for input(s): "HGBA1C" in the last 72 hours. CBG: No results for input(s): "GLUCAP" in the last 168 hours. Lipid Profile: No results for input(s): "CHOL", "HDL", "LDLCALC", "TRIG", "CHOLHDL", "LDLDIRECT" in the last 72 hours. Thyroid Function Tests: No results for input(s): "TSH", "T4TOTAL", "FREET4", "T3FREE", "THYROIDAB" in the last 72 hours. Anemia Panel: No results for input(s): "VITAMINB12", "FOLATE", "FERRITIN", "TIBC", "IRON", "RETICCTPCT" in the last 72 hours. Sepsis Labs: No results for input(s): "PROCALCITON", "LATICACIDVEN" in the last 168 hours.  No results found for this or any previous visit (from the past 240 hour(s)).       Radiology Studies: CT Abdomen Pelvis W Contrast  Result Date: 05/12/2022 CLINICAL DATA:  Diverticulitis. Complication suspected. Complains of bright red blood that started last night. EXAM: CT ABDOMEN AND PELVIS WITH CONTRAST TECHNIQUE: Multidetector CT imaging of the abdomen and pelvis was performed using the standard protocol following bolus administration of intravenous  contrast. RADIATION DOSE REDUCTION: This exam was performed according to the departmental dose-optimization program which includes automated exposure control, adjustment of the mA and/or kV according to patient size and/or use of iterative reconstruction technique. CONTRAST:  136m OMNIPAQUE IOHEXOL 300 MG/ML  SOLN COMPARISON:  CT examination dated December 08, 2017. FINDINGS: Lower chest: No acute abnormality. Hepatobiliary: No focal liver abnormality is seen. No gallstones, gallbladder wall thickening, or biliary dilatation. Pancreas: Mild generalized pancreatic atrophy prominent in the head and neck. No pancreatic ductal dilatation or surrounding inflammatory changes. Spleen: Normal in size without focal abnormality. Adrenals/Urinary Tract: Adrenal glands are unremarkable. Kidneys are normal, without renal calculi, focal lesion, or hydronephrosis. Bladder is unremarkable. Stomach/Bowel: Small hiatal hernia. Stomach and small bowel loops are normal. Normal appendix. There is marked thickening of the transverse and descending colonic wall suggesting severe colitis. Mild adjacent fat stranding. Sigmoid colon and rectum are unremarkable. No evidence  of acute diverticulitis. Vascular/Lymphatic: Mild aortic atherosclerotic calcifications. No lymphadenopathy. Reproductive: Prostate is unremarkable. Other: Small fat containing left inguinal hernia. No abdominopelvic ascites. Musculoskeletal: Degenerate disc disease of the lumbar spine. No acute osseous abnormality. IMPRESSION: 1. Marked thickening of the transverse and descending colonic wall with mild adjacent fat stranding consistent with severe colitis. This is likely secondary to infectious/inflammatory process. 2. Small hiatal hernia. 3. Normal appendix. 4. No evidence of acute diverticulitis. 5. Small fat containing left inguinal hernia. 6. Degenerate disc disease of the lumbar spine. 7. Mild aortic atherosclerosis. Electronically Signed   By: Keane Police D.O.   On:  05/12/2022 14:20        Scheduled Meds:  amLODipine  5 mg Oral Daily   busPIRone  20 mg Oral TID   ezetimibe  10 mg Oral Daily   pantoprazole  40 mg Oral Daily   tamsulosin  0.4 mg Oral BID   Continuous Infusions:  ciprofloxacin 400 mg (05/13/22 1023)   metronidazole 500 mg (05/13/22 1016)     LOS: 0 days    Time spent: 35 minutes    Aaleeyah Bias Darleen Crocker, DO [facility]  If 7PM-7AM, please contact night-coverage [email or web address] 05/13/2022, 1:59 PM

## 2022-05-13 NOTE — Progress Notes (Signed)
Subjective: Having minimal stool passage at this time, does note continued rectal bleeding, now with some clots. Feels that bleeding is slowing down some from initially. Has an occasional very mild abdominal spasm around umbilicus. No nausea or vomiting. Is working on breakfast currently.   Objective: Vital signs in last 24 hours: Temp:  [97.7 F (36.5 C)-98.8 F (37.1 C)] 98 F (36.7 C) (12/13 0356) Pulse Rate:  [65-75] 65 (12/13 0356) Resp:  [14-20] 20 (12/13 0356) BP: (128-141)/(89-98) 140/91 (12/13 0356) SpO2:  [95 %-99 %] 96 % (12/13 0356) Weight:  [90.7 kg] 90.7 kg (12/12 1137) Last BM Date : 05/12/22 General:   Alert and oriented, pleasant Head:  Normocephalic and atraumatic. Eyes:  No icterus, sclera clear. Conjuctiva pink.  Mouth:  Without lesions, mucosa pink and moist.  Neck:  Supple, without thyromegaly or masses.  Heart:  S1, S2 present, no murmurs noted.  Lungs: Clear to auscultation bilaterally, without wheezing, rales, or rhonchi.  Abdomen:  Bowel sounds present, soft, non-tender, non-distended. No HSM or hernias noted. No rebound or guarding. No masses appreciated  Msk:  Symmetrical without gross deformities. Normal posture. Pulses:  Normal pulses noted. Extremities:  Without clubbing or edema. Neurologic:  Alert and  oriented x4;  grossly normal neurologically. Skin:  Warm and dry, intact without significant lesions.  Cervical Nodes:  No significant cervical adenopathy. Psych:  Alert and cooperative. Normal mood and affect.  Intake/Output from previous day: 12/12 0701 - 12/13 0700 In: 2204 [P.O.:120; I.V.:421; IV Piggyback:1663] Out: -  Intake/Output this shift: No intake/output data recorded.  Lab Results: Recent Labs    05/12/22 1203 05/13/22 0430  WBC 7.9 6.3  HGB 16.3 15.3  HCT 47.8 44.2  PLT 200 180   BMET Recent Labs    05/12/22 1203 05/13/22 0430  NA 140 138  K 4.2 3.9  CL 106 108  CO2 24 24  GLUCOSE 96 106*  BUN 13 12  CREATININE  1.11 1.15  CALCIUM 9.2 8.4*   LFT Recent Labs    05/12/22 1203  PROT 7.2  ALBUMIN 4.1  AST 20  ALT 30  ALKPHOS 55  BILITOT 0.7   Studies/Results: CT Abdomen Pelvis W Contrast  Result Date: 05/12/2022 CLINICAL DATA:  Diverticulitis. Complication suspected. Complains of bright red blood that started last night. EXAM: CT ABDOMEN AND PELVIS WITH CONTRAST TECHNIQUE: Multidetector CT imaging of the abdomen and pelvis was performed using the standard protocol following bolus administration of intravenous contrast. RADIATION DOSE REDUCTION: This exam was performed according to the departmental dose-optimization program which includes automated exposure control, adjustment of the mA and/or kV according to patient size and/or use of iterative reconstruction technique. CONTRAST:  169m OMNIPAQUE IOHEXOL 300 MG/ML  SOLN COMPARISON:  CT examination dated December 08, 2017. FINDINGS: Lower chest: No acute abnormality. Hepatobiliary: No focal liver abnormality is seen. No gallstones, gallbladder wall thickening, or biliary dilatation. Pancreas: Mild generalized pancreatic atrophy prominent in the head and neck. No pancreatic ductal dilatation or surrounding inflammatory changes. Spleen: Normal in size without focal abnormality. Adrenals/Urinary Tract: Adrenal glands are unremarkable. Kidneys are normal, without renal calculi, focal lesion, or hydronephrosis. Bladder is unremarkable. Stomach/Bowel: Small hiatal hernia. Stomach and small bowel loops are normal. Normal appendix. There is marked thickening of the transverse and descending colonic wall suggesting severe colitis. Mild adjacent fat stranding. Sigmoid colon and rectum are unremarkable. No evidence of acute diverticulitis. Vascular/Lymphatic: Mild aortic atherosclerotic calcifications. No lymphadenopathy. Reproductive: Prostate is unremarkable. Other: Small fat containing left  inguinal hernia. No abdominopelvic ascites. Musculoskeletal: Degenerate disc  disease of the lumbar spine. No acute osseous abnormality. IMPRESSION: 1. Marked thickening of the transverse and descending colonic wall with mild adjacent fat stranding consistent with severe colitis. This is likely secondary to infectious/inflammatory process. 2. Small hiatal hernia. 3. Normal appendix. 4. No evidence of acute diverticulitis. 5. Small fat containing left inguinal hernia. 6. Degenerate disc disease of the lumbar spine. 7. Mild aortic atherosclerosis. Electronically Signed   By: Keane Police D.O.   On: 05/12/2022 14:20    Assessment: [name] is a 61 y.o. year old male medical history of anxiety, arthritis, dyslipidemia, fatty liver, GERD, hiatal hernia, peripheral neuropathy, sleep apnea who presented to the ED yesterday with abdominal pain, diarrhea and rectal bleeding. GI consulted for further evaluation.    Colitis: abdominal cramping over the past few days with diarrhea and rectal bleeding Monday night.  CT with findings of colitis in transverse and descending colon.  This could be infectious or possibly ischemic colitis given the amount of bleeding that patient reported as well as some recent episodes of hypotension. Frequent NSAID use could also have contributed. GI pathogen panel attempted to be collected this morning but rejected by lab due to volume not being sufficient. reassuringly, Symptoms continue to improve, passing minimal stool now, rectal bleeding appears to be slowing down, labs are all WNL, no anemia. consider CTA if rectal bleeding becomes heavier or if it is accompanied by severe abdominal pain.  For now we will continue with supportive measures, metronidazole and ciprofloxacin antibiotic coverage and consider follow-up colonoscopy as an outpatient in 6 to 8 weeks.    Plan: Continue flagyl and cipro Continue supportive measures Monitor for worsening GI bleeding Trend h&h daily Advance to Soft diet If worsening rectal bleeding/severe abd pain consider  CTA Outpatient colonoscopy 6-8 weeks Follow for GI pathogen panel results   LOS: 0 days    05/13/2022, 9:06 AM   Ruairi Stutsman L. Alver Sorrow, MSN, APRN, AGNP-C Adult-Gerontology Nurse Practitioner [facility]

## 2022-05-13 NOTE — Plan of Care (Signed)
  Problem: Education: Goal: Knowledge of General Education information will improve Description: Including pain rating scale, medication(s)/side effects and non-pharmacologic comfort measures Outcome: Adequate for Discharge   Problem: Health Behavior/Discharge Planning: Goal: Ability to manage health-related needs will improve Outcome: Adequate for Discharge   Problem: Clinical Measurements: Goal: Ability to maintain clinical measurements within normal limits will improve Outcome: Adequate for Discharge Goal: Will remain free from infection Outcome: Adequate for Discharge Goal: Diagnostic test results will improve Outcome: Adequate for Discharge Goal: Respiratory complications will improve Outcome: Adequate for Discharge Goal: Cardiovascular complication will be avoided Outcome: Adequate for Discharge   Problem: Activity: Goal: Risk for activity intolerance will decrease Outcome: Adequate for Discharge   Problem: Nutrition: Goal: Adequate nutrition will be maintained Outcome: Adequate for Discharge   Problem: Coping: Goal: Level of anxiety will decrease Outcome: Adequate for Discharge   Problem: Elimination: Goal: Will not experience complications related to bowel motility Outcome: Adequate for Discharge Goal: Will not experience complications related to urinary retention Outcome: Adequate for Discharge   Problem: Pain Managment: Goal: General experience of comfort will improve Outcome: Adequate for Discharge   Problem: Safety: Goal: Ability to remain free from injury will improve Outcome: Adequate for Discharge   Problem: Skin Integrity: Goal: Risk for impaired skin integrity will decrease Outcome: Adequate for Discharge   Pt discharged to home.

## 2022-05-14 ENCOUNTER — Other Ambulatory Visit (HOSPITAL_COMMUNITY): Payer: Self-pay

## 2022-05-14 ENCOUNTER — Other Ambulatory Visit: Payer: Self-pay

## 2022-05-15 ENCOUNTER — Other Ambulatory Visit: Payer: Self-pay

## 2022-05-21 ENCOUNTER — Other Ambulatory Visit (HOSPITAL_COMMUNITY): Payer: Self-pay

## 2022-05-21 DIAGNOSIS — M5412 Radiculopathy, cervical region: Secondary | ICD-10-CM | POA: Diagnosis not present

## 2022-05-21 DIAGNOSIS — Z6826 Body mass index (BMI) 26.0-26.9, adult: Secondary | ICD-10-CM | POA: Diagnosis not present

## 2022-05-21 DIAGNOSIS — M5030 Other cervical disc degeneration, unspecified cervical region: Secondary | ICD-10-CM | POA: Diagnosis not present

## 2022-05-21 DIAGNOSIS — F1120 Opioid dependence, uncomplicated: Secondary | ICD-10-CM | POA: Diagnosis not present

## 2022-05-21 MED ORDER — HYDROCODONE-ACETAMINOPHEN 5-325 MG PO TABS
1.0000 | ORAL_TABLET | Freq: Three times a day (TID) | ORAL | 0 refills | Status: AC | PRN
  Filled 2022-08-13: qty 60, 20d supply, fill #0

## 2022-05-21 MED ORDER — CYCLOBENZAPRINE HCL 10 MG PO TABS
10.0000 mg | ORAL_TABLET | Freq: Three times a day (TID) | ORAL | 11 refills | Status: DC | PRN
  Filled 2022-06-16: qty 90, 30d supply, fill #0
  Filled 2022-07-16: qty 90, 30d supply, fill #1
  Filled 2022-08-13: qty 90, 30d supply, fill #2
  Filled 2022-09-10: qty 90, 30d supply, fill #3
  Filled 2022-10-19: qty 90, 30d supply, fill #4
  Filled 2022-12-01: qty 90, 30d supply, fill #5
  Filled 2023-01-04: qty 90, 30d supply, fill #6
  Filled 2023-02-03: qty 90, 30d supply, fill #7
  Filled 2023-03-31: qty 90, 30d supply, fill #8
  Filled 2023-05-04: qty 90, 30d supply, fill #9

## 2022-05-21 MED ORDER — HYDROCODONE-ACETAMINOPHEN 5-325 MG PO TABS
1.0000 | ORAL_TABLET | Freq: Three times a day (TID) | ORAL | 0 refills | Status: AC | PRN
  Filled 2022-06-16: qty 60, 20d supply, fill #0

## 2022-05-21 MED ORDER — HYDROCODONE-ACETAMINOPHEN 5-325 MG PO TABS
1.0000 | ORAL_TABLET | Freq: Three times a day (TID) | ORAL | 0 refills | Status: AC | PRN
  Filled 2022-07-16: qty 60, 20d supply, fill #0

## 2022-05-28 DIAGNOSIS — K5900 Constipation, unspecified: Secondary | ICD-10-CM | POA: Diagnosis not present

## 2022-05-28 DIAGNOSIS — K5792 Diverticulitis of intestine, part unspecified, without perforation or abscess without bleeding: Secondary | ICD-10-CM | POA: Diagnosis not present

## 2022-06-03 ENCOUNTER — Other Ambulatory Visit (HOSPITAL_COMMUNITY): Payer: Self-pay

## 2022-06-16 ENCOUNTER — Other Ambulatory Visit: Payer: Self-pay

## 2022-06-16 ENCOUNTER — Other Ambulatory Visit (HOSPITAL_COMMUNITY): Payer: Self-pay

## 2022-06-18 ENCOUNTER — Other Ambulatory Visit (HOSPITAL_COMMUNITY): Payer: Self-pay

## 2022-06-22 ENCOUNTER — Other Ambulatory Visit (HOSPITAL_COMMUNITY): Payer: Self-pay

## 2022-06-29 ENCOUNTER — Other Ambulatory Visit (HOSPITAL_COMMUNITY): Payer: Self-pay

## 2022-06-29 MED ORDER — TAMSULOSIN HCL 0.4 MG PO CAPS
0.8000 mg | ORAL_CAPSULE | Freq: Every day | ORAL | 4 refills | Status: DC
  Filled 2022-06-29 – 2022-12-10 (×4): qty 180, 90d supply, fill #0
  Filled 2023-03-02: qty 180, 90d supply, fill #1
  Filled 2023-05-31: qty 180, 90d supply, fill #2

## 2022-06-29 MED ORDER — TAMSULOSIN HCL 0.4 MG PO CAPS
ORAL_CAPSULE | ORAL | 4 refills | Status: AC
  Filled 2022-06-29: qty 180, 90d supply, fill #0
  Filled 2022-09-10: qty 180, 90d supply, fill #1
  Filled 2023-02-28: qty 180, 90d supply, fill #2

## 2022-06-30 ENCOUNTER — Other Ambulatory Visit (HOSPITAL_COMMUNITY): Payer: Self-pay

## 2022-07-16 ENCOUNTER — Other Ambulatory Visit (HOSPITAL_COMMUNITY): Payer: Self-pay

## 2022-07-16 MED ORDER — FLUTICASONE PROPIONATE 50 MCG/ACT NA SUSP
2.0000 | Freq: Every day | NASAL | 11 refills | Status: AC
  Filled 2022-07-16: qty 16, 30d supply, fill #0
  Filled 2022-11-09 (×2): qty 16, 30d supply, fill #1
  Filled 2022-12-01: qty 16, 30d supply, fill #2
  Filled 2023-01-04: qty 16, 30d supply, fill #3
  Filled 2023-02-03: qty 16, 30d supply, fill #4
  Filled 2023-03-13: qty 16, 30d supply, fill #5
  Filled 2023-04-15: qty 16, 30d supply, fill #6
  Filled 2023-05-12: qty 16, 30d supply, fill #7

## 2022-07-17 ENCOUNTER — Other Ambulatory Visit: Payer: Self-pay

## 2022-07-30 ENCOUNTER — Other Ambulatory Visit (HOSPITAL_COMMUNITY): Payer: Self-pay

## 2022-07-31 ENCOUNTER — Telehealth: Payer: 59 | Admitting: Nurse Practitioner

## 2022-07-31 DIAGNOSIS — J40 Bronchitis, not specified as acute or chronic: Secondary | ICD-10-CM

## 2022-07-31 MED ORDER — BENZONATATE 100 MG PO CAPS: 100.0000 mg | ORAL_CAPSULE | Freq: Three times a day (TID) | ORAL | 0 refills | Status: AC | PRN

## 2022-07-31 MED ORDER — AZITHROMYCIN 250 MG PO TABS: ORAL_TABLET | ORAL | 0 refills | Status: AC

## 2022-07-31 MED ORDER — PREDNISONE 20 MG PO TABS: 20.0000 mg | ORAL_TABLET | Freq: Two times a day (BID) | ORAL | 0 refills | Status: AC

## 2022-07-31 MED ORDER — ALBUTEROL SULFATE HFA 108 (90 BASE) MCG/ACT IN AERS: 2.0000 | INHALATION_SPRAY | Freq: Four times a day (QID) | RESPIRATORY_TRACT | 0 refills | Status: AC | PRN

## 2022-07-31 NOTE — Progress Notes (Signed)
We are sorry that you are not feeling well.  Here is how we plan to help!  Based on your presentation I believe you most likely have A cough due to bacteria.  When patients have a fever and a productive cough with a change in color or increased sputum production, we are concerned about bacterial bronchitis.  If left untreated it can progress to pneumonia.  If your symptoms do not improve with your treatment plan it is important that you contact your provider.   I have prescribed Azithromyin 250 mg: two tablets now and then one tablet daily for 4 additonal days    In addition you may use A prescription cough medication called Tessalon Perles '100mg'$ . You may take 1-2 capsules every 8 hours as needed for your cough.  Prednisone 20 mg twice daily for 5 days (take with food) We will also refill your albuterol inhaler and you should be using that every 4-6 hours as needed for coughing and wheezing  Meds ordered this encounter  Medications   azithromycin (ZITHROMAX) 250 MG tablet    Sig: Take 2 tablets on day 1, then 1 tablet daily on days 2 through 5    Dispense:  6 tablet    Refill:  0   benzonatate (TESSALON) 100 MG capsule    Sig: Take 1 capsule (100 mg total) by mouth 3 (three) times daily as needed.    Dispense:  30 capsule    Refill:  0   albuterol (VENTOLIN HFA) 108 (90 Base) MCG/ACT inhaler    Sig: Inhale 2 puffs into the lungs every 6 (six) hours as needed for wheezing or shortness of breath.    Dispense:  8 g    Refill:  0   predniSONE (DELTASONE) 20 MG tablet    Sig: Take 1 tablet (20 mg total) by mouth 2 (two) times daily with a meal for 5 days.    Dispense:  10 tablet    Refill:  0     From your responses in the eVisit questionnaire you describe inflammation in the upper respiratory tract which is causing a significant cough.  This is commonly called Bronchitis and has four common causes:   Allergies Viral Infections Acid Reflux Bacterial Infection Allergies, viruses and acid  reflux are treated by controlling symptoms or eliminating the cause. An example might be a cough caused by taking certain blood pressure medications. You stop the cough by changing the medication. Another example might be a cough caused by acid reflux. Controlling the reflux helps control the cough.  USE OF BRONCHODILATOR ("RESCUE") INHALERS: There is a risk from using your bronchodilator too frequently.  The risk is that over-reliance on a medication which only relaxes the muscles surrounding the breathing tubes can reduce the effectiveness of medications prescribed to reduce swelling and congestion of the tubes themselves.  Although you feel brief relief from the bronchodilator inhaler, your asthma may actually be worsening with the tubes becoming more swollen and filled with mucus.  This can delay other crucial treatments, such as oral steroid medications. If you need to use a bronchodilator inhaler daily, several times per day, you should discuss this with your provider.  There are probably better treatments that could be used to keep your asthma under control.     HOME CARE Only take medications as instructed by your medical team. Complete the entire course of an antibiotic. Drink plenty of fluids and get plenty of rest. Avoid close contacts especially the very young  and the elderly Cover your mouth if you cough or cough into your sleeve. Always remember to wash your hands A steam or ultrasonic humidifier can help congestion.   GET HELP RIGHT AWAY IF: You develop worsening fever. You become short of breath You cough up blood. Your symptoms persist after you have completed your treatment plan MAKE SURE YOU  Understand these instructions. Will watch your condition. Will get help right away if you are not doing well or get worse.    Thank you for choosing an e-visit.  Your e-visit answers were reviewed by a board certified advanced clinical practitioner to complete your personal care  plan. Depending upon the condition, your plan could have included both over the counter or prescription medications.  Please review your pharmacy choice. Make sure the pharmacy is open so you can pick up prescription now. If there is a problem, you may contact your provider through [employer] and have the prescription routed to another pharmacy.  Your safety is important to [place]. If you have drug allergies check your prescription carefully.   For the next 24 hours you can use MyChart to ask questions about today's visit, request a non-urgent call back, or ask for a work or school excuse. You will get an email in the next two days asking about your experience. I hope that your e-visit has been valuable and will speed your recovery.   I spent approximately 5 minutes reviewing the patient's history, current symptoms and coordinating their care today.

## 2022-08-13 ENCOUNTER — Other Ambulatory Visit (HOSPITAL_COMMUNITY): Payer: Self-pay

## 2022-08-13 ENCOUNTER — Other Ambulatory Visit: Payer: Self-pay

## 2022-08-18 DIAGNOSIS — R109 Unspecified abdominal pain: Secondary | ICD-10-CM | POA: Diagnosis not present

## 2022-08-18 DIAGNOSIS — G8929 Other chronic pain: Secondary | ICD-10-CM | POA: Diagnosis not present

## 2022-08-18 DIAGNOSIS — F419 Anxiety disorder, unspecified: Secondary | ICD-10-CM | POA: Diagnosis not present

## 2022-08-18 DIAGNOSIS — I10 Essential (primary) hypertension: Secondary | ICD-10-CM | POA: Diagnosis not present

## 2022-08-18 DIAGNOSIS — G720 Drug-induced myopathy: Secondary | ICD-10-CM | POA: Diagnosis not present

## 2022-08-18 DIAGNOSIS — E782 Mixed hyperlipidemia: Secondary | ICD-10-CM | POA: Diagnosis not present

## 2022-08-18 DIAGNOSIS — K219 Gastro-esophageal reflux disease without esophagitis: Secondary | ICD-10-CM | POA: Diagnosis not present

## 2022-08-19 ENCOUNTER — Other Ambulatory Visit (HOSPITAL_COMMUNITY): Payer: Self-pay

## 2022-08-19 DIAGNOSIS — M5412 Radiculopathy, cervical region: Secondary | ICD-10-CM | POA: Diagnosis not present

## 2022-08-19 DIAGNOSIS — M5030 Other cervical disc degeneration, unspecified cervical region: Secondary | ICD-10-CM | POA: Diagnosis not present

## 2022-08-19 DIAGNOSIS — Z6829 Body mass index (BMI) 29.0-29.9, adult: Secondary | ICD-10-CM | POA: Diagnosis not present

## 2022-08-19 DIAGNOSIS — F1120 Opioid dependence, uncomplicated: Secondary | ICD-10-CM | POA: Diagnosis not present

## 2022-08-19 MED ORDER — HYDROCODONE-ACETAMINOPHEN 5-325 MG PO TABS
1.0000 | ORAL_TABLET | Freq: Three times a day (TID) | ORAL | 0 refills | Status: AC
  Filled 2022-09-12 – 2022-09-15 (×2): qty 60, 20d supply, fill #0
  Filled ????-??-??: fill #0

## 2022-08-19 MED ORDER — HYDROCODONE-ACETAMINOPHEN 5-325 MG PO TABS
1.0000 | ORAL_TABLET | Freq: Three times a day (TID) | ORAL | 0 refills | Status: AC
  Filled 2022-10-19: qty 60, 20d supply, fill #0

## 2022-08-19 MED ORDER — HYDROCODONE-ACETAMINOPHEN 5-325 MG PO TABS
1.0000 | ORAL_TABLET | Freq: Three times a day (TID) | ORAL | 0 refills | Status: AC
  Filled 2022-11-11: qty 60, 20d supply, fill #0

## 2022-08-20 ENCOUNTER — Other Ambulatory Visit (HOSPITAL_COMMUNITY): Payer: Self-pay

## 2022-09-04 ENCOUNTER — Other Ambulatory Visit (HOSPITAL_COMMUNITY): Payer: Self-pay

## 2022-09-10 ENCOUNTER — Other Ambulatory Visit (HOSPITAL_COMMUNITY): Payer: Self-pay

## 2022-09-12 ENCOUNTER — Other Ambulatory Visit (HOSPITAL_COMMUNITY): Payer: Self-pay

## 2022-09-15 ENCOUNTER — Other Ambulatory Visit (HOSPITAL_COMMUNITY): Payer: Self-pay

## 2022-09-15 ENCOUNTER — Other Ambulatory Visit: Payer: Self-pay

## 2022-09-15 MED ORDER — ALPRAZOLAM 1 MG PO TABS
0.5000 mg | ORAL_TABLET | Freq: Three times a day (TID) | ORAL | 0 refills | Status: DC | PRN
  Filled 2022-09-15: qty 45, 30d supply, fill #0

## 2022-09-16 ENCOUNTER — Other Ambulatory Visit: Payer: Self-pay

## 2022-09-24 ENCOUNTER — Other Ambulatory Visit: Payer: Self-pay | Admitting: Gastroenterology

## 2022-09-24 DIAGNOSIS — R109 Unspecified abdominal pain: Secondary | ICD-10-CM

## 2022-09-24 DIAGNOSIS — K219 Gastro-esophageal reflux disease without esophagitis: Secondary | ICD-10-CM | POA: Diagnosis not present

## 2022-09-24 DIAGNOSIS — R935 Abnormal findings on diagnostic imaging of other abdominal regions, including retroperitoneum: Secondary | ICD-10-CM | POA: Diagnosis not present

## 2022-10-19 ENCOUNTER — Other Ambulatory Visit: Payer: Self-pay

## 2022-10-19 ENCOUNTER — Other Ambulatory Visit (HOSPITAL_COMMUNITY): Payer: Self-pay

## 2022-10-20 ENCOUNTER — Other Ambulatory Visit (HOSPITAL_COMMUNITY): Payer: Self-pay

## 2022-10-20 ENCOUNTER — Other Ambulatory Visit: Payer: Self-pay

## 2022-10-20 ENCOUNTER — Ambulatory Visit
Admission: RE | Admit: 2022-10-20 | Discharge: 2022-10-20 | Disposition: A | Discharge status: Home / Self Care | Payer: 59 | Source: Ambulatory Visit | Attending: Gastroenterology | Admitting: Gastroenterology

## 2022-10-20 DIAGNOSIS — K7689 Other specified diseases of liver: Secondary | ICD-10-CM | POA: Diagnosis not present

## 2022-10-20 DIAGNOSIS — R109 Unspecified abdominal pain: Secondary | ICD-10-CM

## 2022-10-20 MED ORDER — ALPRAZOLAM 1 MG PO TABS
0.5000 mg | ORAL_TABLET | Freq: Three times a day (TID) | ORAL | 0 refills | Status: DC | PRN
  Filled 2022-10-20: qty 45, 30d supply, fill #0

## 2022-10-27 DIAGNOSIS — R1013 Epigastric pain: Secondary | ICD-10-CM | POA: Diagnosis not present

## 2022-10-27 DIAGNOSIS — D123 Benign neoplasm of transverse colon: Secondary | ICD-10-CM | POA: Diagnosis not present

## 2022-10-27 DIAGNOSIS — R1084 Generalized abdominal pain: Secondary | ICD-10-CM | POA: Diagnosis not present

## 2022-10-27 DIAGNOSIS — R933 Abnormal findings on diagnostic imaging of other parts of digestive tract: Secondary | ICD-10-CM | POA: Diagnosis not present

## 2022-10-27 DIAGNOSIS — R197 Diarrhea, unspecified: Secondary | ICD-10-CM | POA: Diagnosis not present

## 2022-10-27 DIAGNOSIS — K317 Polyp of stomach and duodenum: Secondary | ICD-10-CM | POA: Diagnosis not present

## 2022-11-09 ENCOUNTER — Other Ambulatory Visit: Payer: Self-pay

## 2022-11-09 ENCOUNTER — Other Ambulatory Visit (HOSPITAL_COMMUNITY): Payer: Self-pay

## 2022-11-09 MED ORDER — ALBUTEROL SULFATE HFA 108 (90 BASE) MCG/ACT IN AERS
2.0000 | INHALATION_SPRAY | RESPIRATORY_TRACT | 4 refills | Status: AC | PRN
  Filled 2022-11-09: qty 6.7, 17d supply, fill #0
  Filled 2023-01-04: qty 6.7, 17d supply, fill #1
  Filled 2023-03-13: qty 6.7, 17d supply, fill #2
  Filled 2023-04-15: qty 6.7, 17d supply, fill #3
  Filled 2023-05-12: qty 6.7, 16d supply, fill #4

## 2022-11-10 ENCOUNTER — Other Ambulatory Visit (HOSPITAL_COMMUNITY): Payer: Self-pay

## 2022-11-10 ENCOUNTER — Other Ambulatory Visit: Payer: Self-pay

## 2022-11-10 MED ORDER — OMEPRAZOLE 40 MG PO CPDR
40.0000 mg | DELAYED_RELEASE_CAPSULE | Freq: Two times a day (BID) | ORAL | 3 refills | Status: DC
  Filled 2022-11-10: qty 180, 90d supply, fill #0
  Filled 2023-02-03: qty 180, 90d supply, fill #1
  Filled 2023-05-04: qty 180, 90d supply, fill #2
  Filled 2023-08-02: qty 180, 90d supply, fill #3

## 2022-11-11 ENCOUNTER — Other Ambulatory Visit: Payer: Self-pay

## 2022-11-11 ENCOUNTER — Other Ambulatory Visit (HOSPITAL_COMMUNITY): Payer: Self-pay

## 2022-11-18 DIAGNOSIS — M542 Cervicalgia: Secondary | ICD-10-CM | POA: Diagnosis not present

## 2022-11-18 DIAGNOSIS — M5030 Other cervical disc degeneration, unspecified cervical region: Secondary | ICD-10-CM | POA: Diagnosis not present

## 2022-11-18 DIAGNOSIS — M5412 Radiculopathy, cervical region: Secondary | ICD-10-CM | POA: Diagnosis not present

## 2022-11-18 DIAGNOSIS — Z6828 Body mass index (BMI) 28.0-28.9, adult: Secondary | ICD-10-CM | POA: Diagnosis not present

## 2022-11-18 DIAGNOSIS — M5136 Other intervertebral disc degeneration, lumbar region: Secondary | ICD-10-CM | POA: Diagnosis not present

## 2022-11-19 ENCOUNTER — Other Ambulatory Visit (HOSPITAL_COMMUNITY): Payer: Self-pay

## 2022-11-19 MED ORDER — HYDROCODONE-ACETAMINOPHEN 5-325 MG PO TABS
1.0000 | ORAL_TABLET | Freq: Three times a day (TID) | ORAL | 0 refills | Status: AC | PRN
  Filled 2022-12-10: qty 60, 20d supply, fill #0

## 2022-11-19 MED ORDER — HYDROCODONE-ACETAMINOPHEN 5-325 MG PO TABS
1.0000 | ORAL_TABLET | Freq: Three times a day (TID) | ORAL | 0 refills | Status: AC | PRN
  Filled 2023-01-09: qty 60, 20d supply, fill #0

## 2022-11-19 MED ORDER — HYDROCODONE-ACETAMINOPHEN 5-325 MG PO TABS
1.0000 | ORAL_TABLET | Freq: Three times a day (TID) | ORAL | 0 refills | Status: AC | PRN
  Filled 2022-12-01 – 2023-02-10 (×3): qty 60, 20d supply, fill #0

## 2022-11-20 ENCOUNTER — Other Ambulatory Visit (HOSPITAL_COMMUNITY): Payer: Self-pay

## 2022-12-01 ENCOUNTER — Other Ambulatory Visit (HOSPITAL_COMMUNITY): Payer: Self-pay

## 2022-12-01 ENCOUNTER — Other Ambulatory Visit: Payer: Self-pay

## 2022-12-01 MED ORDER — ALPRAZOLAM 1 MG PO TABS
0.5000 mg | ORAL_TABLET | Freq: Three times a day (TID) | ORAL | 0 refills | Status: AC | PRN
  Filled 2022-12-01: qty 45, 30d supply, fill #0

## 2022-12-02 ENCOUNTER — Other Ambulatory Visit: Payer: Self-pay

## 2022-12-10 ENCOUNTER — Other Ambulatory Visit (HOSPITAL_COMMUNITY): Payer: Self-pay

## 2022-12-10 ENCOUNTER — Other Ambulatory Visit: Payer: Self-pay

## 2022-12-17 DIAGNOSIS — H02834 Dermatochalasis of left upper eyelid: Secondary | ICD-10-CM | POA: Diagnosis not present

## 2022-12-17 DIAGNOSIS — H04123 Dry eye syndrome of bilateral lacrimal glands: Secondary | ICD-10-CM | POA: Diagnosis not present

## 2022-12-17 DIAGNOSIS — H02831 Dermatochalasis of right upper eyelid: Secondary | ICD-10-CM | POA: Diagnosis not present

## 2022-12-17 DIAGNOSIS — H43393 Other vitreous opacities, bilateral: Secondary | ICD-10-CM | POA: Diagnosis not present

## 2022-12-17 DIAGNOSIS — H2513 Age-related nuclear cataract, bilateral: Secondary | ICD-10-CM | POA: Diagnosis not present

## 2023-01-04 ENCOUNTER — Other Ambulatory Visit (HOSPITAL_COMMUNITY): Payer: Self-pay

## 2023-01-04 MED ORDER — ALPRAZOLAM 1 MG PO TABS
0.5000 mg | ORAL_TABLET | Freq: Three times a day (TID) | ORAL | 0 refills | Status: AC
  Filled 2023-01-04: qty 45, 30d supply, fill #0

## 2023-01-05 ENCOUNTER — Other Ambulatory Visit: Payer: Self-pay

## 2023-01-09 ENCOUNTER — Other Ambulatory Visit (HOSPITAL_COMMUNITY): Payer: Self-pay

## 2023-01-11 ENCOUNTER — Other Ambulatory Visit: Payer: Self-pay

## 2023-01-11 ENCOUNTER — Other Ambulatory Visit (HOSPITAL_BASED_OUTPATIENT_CLINIC_OR_DEPARTMENT_OTHER): Payer: Self-pay

## 2023-01-11 ENCOUNTER — Other Ambulatory Visit (HOSPITAL_COMMUNITY): Payer: Self-pay

## 2023-01-12 ENCOUNTER — Other Ambulatory Visit (HOSPITAL_COMMUNITY): Payer: Self-pay

## 2023-01-13 ENCOUNTER — Other Ambulatory Visit (HOSPITAL_COMMUNITY): Payer: Self-pay

## 2023-01-13 ENCOUNTER — Other Ambulatory Visit: Payer: Self-pay

## 2023-01-26 ENCOUNTER — Other Ambulatory Visit (HOSPITAL_COMMUNITY): Payer: Self-pay

## 2023-02-03 ENCOUNTER — Other Ambulatory Visit: Payer: Self-pay

## 2023-02-03 ENCOUNTER — Other Ambulatory Visit (HOSPITAL_COMMUNITY): Payer: Self-pay

## 2023-02-03 MED ORDER — ALPRAZOLAM 1 MG PO TABS
0.5000 mg | ORAL_TABLET | Freq: Three times a day (TID) | ORAL | 0 refills | Status: AC | PRN
  Filled 2023-02-03: qty 45, 30d supply, fill #0

## 2023-02-10 ENCOUNTER — Other Ambulatory Visit (HOSPITAL_COMMUNITY): Payer: Self-pay

## 2023-02-10 ENCOUNTER — Other Ambulatory Visit: Payer: Self-pay

## 2023-02-18 DIAGNOSIS — Z0000 Encounter for general adult medical examination without abnormal findings: Secondary | ICD-10-CM | POA: Diagnosis not present

## 2023-02-18 DIAGNOSIS — G629 Polyneuropathy, unspecified: Secondary | ICD-10-CM | POA: Diagnosis not present

## 2023-02-18 DIAGNOSIS — G720 Drug-induced myopathy: Secondary | ICD-10-CM | POA: Diagnosis not present

## 2023-02-18 DIAGNOSIS — K219 Gastro-esophageal reflux disease without esophagitis: Secondary | ICD-10-CM | POA: Diagnosis not present

## 2023-02-18 DIAGNOSIS — M5090 Cervical disc disorder, unspecified, unspecified cervical region: Secondary | ICD-10-CM | POA: Diagnosis not present

## 2023-02-18 DIAGNOSIS — Z125 Encounter for screening for malignant neoplasm of prostate: Secondary | ICD-10-CM | POA: Diagnosis not present

## 2023-02-18 DIAGNOSIS — E782 Mixed hyperlipidemia: Secondary | ICD-10-CM | POA: Diagnosis not present

## 2023-02-18 DIAGNOSIS — N182 Chronic kidney disease, stage 2 (mild): Secondary | ICD-10-CM | POA: Diagnosis not present

## 2023-02-18 DIAGNOSIS — F419 Anxiety disorder, unspecified: Secondary | ICD-10-CM | POA: Diagnosis not present

## 2023-02-18 DIAGNOSIS — I10 Essential (primary) hypertension: Secondary | ICD-10-CM | POA: Diagnosis not present

## 2023-02-18 DIAGNOSIS — G8929 Other chronic pain: Secondary | ICD-10-CM | POA: Diagnosis not present

## 2023-02-28 ENCOUNTER — Other Ambulatory Visit (HOSPITAL_COMMUNITY): Payer: Self-pay

## 2023-03-01 ENCOUNTER — Other Ambulatory Visit (HOSPITAL_COMMUNITY): Payer: Self-pay

## 2023-03-01 MED ORDER — EZETIMIBE 10 MG PO TABS
10.0000 mg | ORAL_TABLET | Freq: Every day | ORAL | 3 refills | Status: DC
  Filled 2023-03-01: qty 90, 90d supply, fill #0
  Filled 2023-05-31: qty 90, 90d supply, fill #1
  Filled 2023-08-27: qty 90, 90d supply, fill #2
  Filled 2023-12-04: qty 90, 90d supply, fill #3

## 2023-03-03 ENCOUNTER — Other Ambulatory Visit (HOSPITAL_COMMUNITY): Payer: Self-pay

## 2023-03-03 DIAGNOSIS — M542 Cervicalgia: Secondary | ICD-10-CM | POA: Diagnosis not present

## 2023-03-03 DIAGNOSIS — F1120 Opioid dependence, uncomplicated: Secondary | ICD-10-CM | POA: Diagnosis not present

## 2023-03-03 DIAGNOSIS — M47816 Spondylosis without myelopathy or radiculopathy, lumbar region: Secondary | ICD-10-CM | POA: Diagnosis not present

## 2023-03-03 DIAGNOSIS — M5412 Radiculopathy, cervical region: Secondary | ICD-10-CM | POA: Diagnosis not present

## 2023-03-03 MED ORDER — HYDROCODONE-ACETAMINOPHEN 5-325 MG PO TABS
1.0000 | ORAL_TABLET | Freq: Three times a day (TID) | ORAL | 0 refills | Status: AC | PRN
  Filled 2023-03-13: qty 60, 20d supply, fill #0

## 2023-03-03 MED ORDER — HYDROCODONE-ACETAMINOPHEN 5-325 MG PO TABS
1.0000 | ORAL_TABLET | Freq: Three times a day (TID) | ORAL | 0 refills | Status: AC | PRN
  Filled 2023-04-15: qty 60, 20d supply, fill #0

## 2023-03-03 MED ORDER — HYDROCODONE-ACETAMINOPHEN 5-325 MG PO TABS
1.0000 | ORAL_TABLET | Freq: Three times a day (TID) | ORAL | 0 refills | Status: AC | PRN
  Filled 2023-05-12: qty 60, 20d supply, fill #0

## 2023-03-03 MED ORDER — CYCLOBENZAPRINE HCL 10 MG PO TABS
10.0000 mg | ORAL_TABLET | Freq: Three times a day (TID) | ORAL | 11 refills | Status: DC | PRN
  Filled 2023-03-03: qty 90, 30d supply, fill #0

## 2023-03-04 ENCOUNTER — Other Ambulatory Visit: Payer: Self-pay

## 2023-03-04 ENCOUNTER — Other Ambulatory Visit (HOSPITAL_COMMUNITY): Payer: Self-pay

## 2023-03-13 ENCOUNTER — Other Ambulatory Visit (HOSPITAL_COMMUNITY): Payer: Self-pay

## 2023-03-15 ENCOUNTER — Other Ambulatory Visit: Payer: Self-pay

## 2023-03-15 ENCOUNTER — Other Ambulatory Visit (HOSPITAL_COMMUNITY): Payer: Self-pay

## 2023-03-15 MED ORDER — ALPRAZOLAM 1 MG PO TABS
0.5000 mg | ORAL_TABLET | Freq: Three times a day (TID) | ORAL | 0 refills | Status: AC | PRN
  Filled 2023-03-15: qty 45, 30d supply, fill #0

## 2023-03-16 ENCOUNTER — Other Ambulatory Visit (HOSPITAL_COMMUNITY): Payer: Self-pay

## 2023-03-30 DIAGNOSIS — H903 Sensorineural hearing loss, bilateral: Secondary | ICD-10-CM | POA: Diagnosis not present

## 2023-03-31 ENCOUNTER — Other Ambulatory Visit: Payer: Self-pay

## 2023-04-15 ENCOUNTER — Other Ambulatory Visit: Payer: Self-pay

## 2023-04-15 ENCOUNTER — Other Ambulatory Visit (HOSPITAL_COMMUNITY): Payer: Self-pay

## 2023-04-15 MED ORDER — ALPRAZOLAM 1 MG PO TABS
0.5000 mg | ORAL_TABLET | Freq: Three times a day (TID) | ORAL | 0 refills | Status: AC | PRN
  Filled 2023-04-15: qty 45, 30d supply, fill #0

## 2023-04-17 ENCOUNTER — Other Ambulatory Visit (HOSPITAL_COMMUNITY): Payer: Self-pay

## 2023-04-20 ENCOUNTER — Other Ambulatory Visit (HOSPITAL_COMMUNITY): Payer: Self-pay

## 2023-04-27 ENCOUNTER — Other Ambulatory Visit: Payer: Self-pay

## 2023-05-04 ENCOUNTER — Other Ambulatory Visit: Payer: Self-pay

## 2023-05-04 ENCOUNTER — Other Ambulatory Visit (HOSPITAL_COMMUNITY): Payer: Self-pay

## 2023-05-06 ENCOUNTER — Other Ambulatory Visit: Payer: Self-pay

## 2023-05-06 ENCOUNTER — Other Ambulatory Visit (HOSPITAL_COMMUNITY): Payer: Self-pay

## 2023-05-06 DIAGNOSIS — Z461 Encounter for fitting and adjustment of hearing aid: Secondary | ICD-10-CM | POA: Diagnosis not present

## 2023-05-06 MED ORDER — BUSPIRONE HCL 30 MG PO TABS
ORAL_TABLET | ORAL | 3 refills | Status: AC
  Filled 2023-05-06: qty 202, 90d supply, fill #0
  Filled 2023-08-02: qty 202, 90d supply, fill #1
  Filled 2023-10-26: qty 202, 90d supply, fill #2
  Filled 2024-01-25: qty 202, 90d supply, fill #3
  Filled 2024-04-21: qty 202, 90d supply, fill #4

## 2023-05-12 ENCOUNTER — Other Ambulatory Visit: Payer: Self-pay

## 2023-05-12 ENCOUNTER — Other Ambulatory Visit (HOSPITAL_COMMUNITY): Payer: Self-pay

## 2023-05-12 MED ORDER — ALPRAZOLAM 1 MG PO TABS
0.5000 mg | ORAL_TABLET | Freq: Three times a day (TID) | ORAL | 0 refills | Status: DC | PRN
  Filled 2023-05-13: qty 45, 30d supply, fill #0

## 2023-05-13 ENCOUNTER — Other Ambulatory Visit (HOSPITAL_COMMUNITY): Payer: Self-pay

## 2023-05-13 ENCOUNTER — Other Ambulatory Visit: Payer: Self-pay

## 2023-05-31 ENCOUNTER — Other Ambulatory Visit (HOSPITAL_BASED_OUTPATIENT_CLINIC_OR_DEPARTMENT_OTHER): Payer: Self-pay

## 2023-05-31 ENCOUNTER — Other Ambulatory Visit: Payer: Self-pay

## 2023-05-31 ENCOUNTER — Other Ambulatory Visit (HOSPITAL_COMMUNITY): Payer: Self-pay

## 2023-05-31 MED ORDER — CYCLOBENZAPRINE HCL 10 MG PO TABS
10.0000 mg | ORAL_TABLET | Freq: Three times a day (TID) | ORAL | 11 refills | Status: AC | PRN
  Filled 2023-05-31: qty 90, 30d supply, fill #0
  Filled 2023-06-29: qty 90, 30d supply, fill #1
  Filled 2023-07-22: qty 90, 30d supply, fill #2
  Filled 2023-08-18: qty 90, 30d supply, fill #3
  Filled 2023-09-15: qty 90, 30d supply, fill #4
  Filled 2023-10-11: qty 90, 30d supply, fill #5
  Filled 2023-11-15: qty 90, 30d supply, fill #6
  Filled 2023-12-15: qty 90, 30d supply, fill #7
  Filled 2024-01-10: qty 90, 30d supply, fill #8
  Filled 2024-02-08: qty 90, 30d supply, fill #9
  Filled 2024-03-06: qty 90, 30d supply, fill #10
  Filled 2024-04-03: qty 90, 30d supply, fill #11

## 2023-06-07 ENCOUNTER — Other Ambulatory Visit (HOSPITAL_COMMUNITY): Payer: Self-pay

## 2023-06-07 DIAGNOSIS — M47816 Spondylosis without myelopathy or radiculopathy, lumbar region: Secondary | ICD-10-CM | POA: Diagnosis not present

## 2023-06-07 DIAGNOSIS — M542 Cervicalgia: Secondary | ICD-10-CM | POA: Diagnosis not present

## 2023-06-07 DIAGNOSIS — F1120 Opioid dependence, uncomplicated: Secondary | ICD-10-CM | POA: Diagnosis not present

## 2023-06-07 DIAGNOSIS — Z79891 Long term (current) use of opiate analgesic: Secondary | ICD-10-CM | POA: Diagnosis not present

## 2023-06-07 MED ORDER — HYDROCODONE-ACETAMINOPHEN 5-325 MG PO TABS
1.0000 | ORAL_TABLET | Freq: Three times a day (TID) | ORAL | 0 refills | Status: AC | PRN
  Filled 2023-06-11: qty 60, 20d supply, fill #0

## 2023-06-07 MED ORDER — HYDROCODONE-ACETAMINOPHEN 5-325 MG PO TABS
1.0000 | ORAL_TABLET | Freq: Three times a day (TID) | ORAL | 0 refills | Status: AC
  Filled 2023-08-18: qty 60, 20d supply, fill #0

## 2023-06-07 MED ORDER — HYDROCODONE-ACETAMINOPHEN 5-325 MG PO TABS
1.0000 | ORAL_TABLET | Freq: Three times a day (TID) | ORAL | 0 refills | Status: AC
  Filled 2023-07-15: qty 60, 20d supply, fill #0

## 2023-06-09 ENCOUNTER — Other Ambulatory Visit (HOSPITAL_COMMUNITY): Payer: Self-pay

## 2023-06-11 ENCOUNTER — Other Ambulatory Visit (HOSPITAL_COMMUNITY): Payer: Self-pay

## 2023-06-11 ENCOUNTER — Other Ambulatory Visit: Payer: Self-pay

## 2023-06-11 MED ORDER — ALPRAZOLAM 1 MG PO TABS
0.5000 mg | ORAL_TABLET | Freq: Three times a day (TID) | ORAL | 0 refills | Status: DC | PRN
  Filled 2023-06-11: qty 45, 30d supply, fill #0

## 2023-06-29 ENCOUNTER — Other Ambulatory Visit (HOSPITAL_COMMUNITY): Payer: Self-pay

## 2023-07-03 ENCOUNTER — Telehealth: Payer: Medicare Other | Admitting: Family Medicine

## 2023-07-03 DIAGNOSIS — J069 Acute upper respiratory infection, unspecified: Secondary | ICD-10-CM

## 2023-07-03 MED ORDER — PROMETHAZINE-DM 6.25-15 MG/5ML PO SYRP: 5.0000 mL | ORAL_SOLUTION | Freq: Four times a day (QID) | ORAL | 0 refills | Status: AC | PRN

## 2023-07-03 NOTE — Progress Notes (Signed)

## 2023-07-08 ENCOUNTER — Other Ambulatory Visit (HOSPITAL_COMMUNITY): Payer: Self-pay

## 2023-07-09 ENCOUNTER — Other Ambulatory Visit (HOSPITAL_COMMUNITY): Payer: Self-pay

## 2023-07-09 MED ORDER — ALPRAZOLAM 1 MG PO TABS
0.5000 mg | ORAL_TABLET | Freq: Three times a day (TID) | ORAL | 3 refills | Status: DC | PRN
  Filled 2023-07-09: qty 45, 30d supply, fill #0
  Filled 2023-08-18: qty 45, 30d supply, fill #1
  Filled 2023-09-15: qty 45, 30d supply, fill #2
  Filled 2023-10-11 – 2023-10-13 (×2): qty 45, 30d supply, fill #3

## 2023-07-15 ENCOUNTER — Other Ambulatory Visit (HOSPITAL_COMMUNITY): Payer: Self-pay

## 2023-07-22 ENCOUNTER — Other Ambulatory Visit (HOSPITAL_COMMUNITY): Payer: Self-pay

## 2023-07-22 ENCOUNTER — Other Ambulatory Visit: Payer: Self-pay

## 2023-07-22 MED ORDER — AMLODIPINE BESYLATE 5 MG PO TABS
5.0000 mg | ORAL_TABLET | Freq: Every day | ORAL | 5 refills | Status: AC
  Filled 2023-07-22: qty 90, 90d supply, fill #0
  Filled 2023-10-20: qty 90, 90d supply, fill #1
  Filled 2024-01-18: qty 90, 90d supply, fill #2
  Filled 2024-04-12: qty 90, 90d supply, fill #3

## 2023-08-02 ENCOUNTER — Other Ambulatory Visit (HOSPITAL_COMMUNITY): Payer: Self-pay

## 2023-08-10 ENCOUNTER — Other Ambulatory Visit (HOSPITAL_COMMUNITY): Payer: Self-pay

## 2023-08-18 ENCOUNTER — Other Ambulatory Visit: Payer: Self-pay

## 2023-08-18 ENCOUNTER — Other Ambulatory Visit (HOSPITAL_COMMUNITY): Payer: Self-pay

## 2023-08-24 ENCOUNTER — Other Ambulatory Visit (HOSPITAL_COMMUNITY): Payer: Self-pay

## 2023-08-24 ENCOUNTER — Other Ambulatory Visit: Payer: Self-pay

## 2023-08-24 DIAGNOSIS — G720 Drug-induced myopathy: Secondary | ICD-10-CM | POA: Diagnosis not present

## 2023-08-24 DIAGNOSIS — N182 Chronic kidney disease, stage 2 (mild): Secondary | ICD-10-CM | POA: Diagnosis not present

## 2023-08-24 DIAGNOSIS — G8929 Other chronic pain: Secondary | ICD-10-CM | POA: Diagnosis not present

## 2023-08-24 DIAGNOSIS — K589 Irritable bowel syndrome without diarrhea: Secondary | ICD-10-CM | POA: Diagnosis not present

## 2023-08-24 DIAGNOSIS — F419 Anxiety disorder, unspecified: Secondary | ICD-10-CM | POA: Diagnosis not present

## 2023-08-24 DIAGNOSIS — I10 Essential (primary) hypertension: Secondary | ICD-10-CM | POA: Diagnosis not present

## 2023-08-24 DIAGNOSIS — E782 Mixed hyperlipidemia: Secondary | ICD-10-CM | POA: Diagnosis not present

## 2023-08-24 MED ORDER — HYOSCYAMINE SULFATE 0.125 MG SL SUBL
0.1250 mg | SUBLINGUAL_TABLET | Freq: Three times a day (TID) | SUBLINGUAL | 5 refills | Status: AC | PRN
Start: 2023-08-24 — End: ?
  Filled 2023-08-24: qty 30, 10d supply, fill #0
  Filled 2023-10-11: qty 30, 10d supply, fill #1
  Filled 2023-11-15: qty 30, 10d supply, fill #2
  Filled 2023-12-29: qty 30, 10d supply, fill #3

## 2023-08-27 ENCOUNTER — Other Ambulatory Visit: Payer: Self-pay

## 2023-08-27 ENCOUNTER — Other Ambulatory Visit (HOSPITAL_COMMUNITY): Payer: Self-pay

## 2023-08-27 MED ORDER — TAMSULOSIN HCL 0.4 MG PO CAPS
0.8000 mg | ORAL_CAPSULE | Freq: Every day | ORAL | 11 refills | Status: DC
  Filled 2023-08-27: qty 60, 30d supply, fill #0
  Filled 2023-10-26: qty 60, 30d supply, fill #1
  Filled 2023-12-04: qty 60, 30d supply, fill #2
  Filled 2023-12-29: qty 60, 30d supply, fill #3
  Filled 2024-01-25: qty 60, 30d supply, fill #4
  Filled 2024-02-24: qty 60, 30d supply, fill #5
  Filled 2024-03-21: qty 60, 30d supply, fill #6
  Filled 2024-04-12 – 2024-04-14 (×2): qty 60, 30d supply, fill #7
  Filled 2024-05-08: qty 60, 30d supply, fill #8
  Filled 2024-06-07: qty 60, 30d supply, fill #9
  Filled 2024-06-28: qty 60, 30d supply, fill #10

## 2023-09-02 ENCOUNTER — Other Ambulatory Visit (HOSPITAL_COMMUNITY): Payer: Self-pay

## 2023-09-02 ENCOUNTER — Other Ambulatory Visit: Payer: Self-pay

## 2023-09-02 DIAGNOSIS — D2372 Other benign neoplasm of skin of left lower limb, including hip: Secondary | ICD-10-CM | POA: Diagnosis not present

## 2023-09-02 DIAGNOSIS — L309 Dermatitis, unspecified: Secondary | ICD-10-CM | POA: Diagnosis not present

## 2023-09-02 DIAGNOSIS — D225 Melanocytic nevi of trunk: Secondary | ICD-10-CM | POA: Diagnosis not present

## 2023-09-02 DIAGNOSIS — D2261 Melanocytic nevi of right upper limb, including shoulder: Secondary | ICD-10-CM | POA: Diagnosis not present

## 2023-09-02 DIAGNOSIS — D1801 Hemangioma of skin and subcutaneous tissue: Secondary | ICD-10-CM | POA: Diagnosis not present

## 2023-09-02 DIAGNOSIS — L814 Other melanin hyperpigmentation: Secondary | ICD-10-CM | POA: Diagnosis not present

## 2023-09-02 DIAGNOSIS — Z85828 Personal history of other malignant neoplasm of skin: Secondary | ICD-10-CM | POA: Diagnosis not present

## 2023-09-02 MED ORDER — BETAMETHASONE DIPROPIONATE AUG 0.05 % EX CREA
TOPICAL_CREAM | CUTANEOUS | 2 refills | Status: AC
  Filled 2023-09-02: qty 50, 30d supply, fill #0
  Filled 2024-05-31: qty 50, 30d supply, fill #1

## 2023-09-14 ENCOUNTER — Other Ambulatory Visit (HOSPITAL_COMMUNITY): Payer: Self-pay

## 2023-09-14 DIAGNOSIS — M5412 Radiculopathy, cervical region: Secondary | ICD-10-CM | POA: Diagnosis not present

## 2023-09-14 DIAGNOSIS — M542 Cervicalgia: Secondary | ICD-10-CM | POA: Diagnosis not present

## 2023-09-14 DIAGNOSIS — F1120 Opioid dependence, uncomplicated: Secondary | ICD-10-CM | POA: Diagnosis not present

## 2023-09-14 DIAGNOSIS — M47816 Spondylosis without myelopathy or radiculopathy, lumbar region: Secondary | ICD-10-CM | POA: Diagnosis not present

## 2023-09-14 MED ORDER — HYDROCODONE-ACETAMINOPHEN 5-325 MG PO TABS
1.0000 | ORAL_TABLET | Freq: Three times a day (TID) | ORAL | 0 refills | Status: AC | PRN
  Filled 2023-09-20: qty 60, 20d supply, fill #0

## 2023-09-14 MED ORDER — HYDROCODONE-ACETAMINOPHEN 5-325 MG PO TABS
1.0000 | ORAL_TABLET | Freq: Three times a day (TID) | ORAL | 0 refills | Status: AC | PRN
  Filled 2023-10-26: qty 60, 20d supply, fill #0

## 2023-09-14 MED ORDER — HYDROCODONE-ACETAMINOPHEN 5-325 MG PO TABS
1.0000 | ORAL_TABLET | Freq: Three times a day (TID) | ORAL | 0 refills | Status: AC | PRN
  Filled 2023-12-06: qty 60, 20d supply, fill #0

## 2023-09-15 ENCOUNTER — Other Ambulatory Visit: Payer: Self-pay

## 2023-09-15 ENCOUNTER — Other Ambulatory Visit (HOSPITAL_COMMUNITY): Payer: Self-pay

## 2023-09-20 ENCOUNTER — Other Ambulatory Visit: Payer: Self-pay

## 2023-09-20 ENCOUNTER — Other Ambulatory Visit (HOSPITAL_COMMUNITY): Payer: Self-pay

## 2023-10-11 ENCOUNTER — Other Ambulatory Visit (HOSPITAL_COMMUNITY): Payer: Self-pay

## 2023-10-11 ENCOUNTER — Other Ambulatory Visit: Payer: Self-pay

## 2023-10-13 ENCOUNTER — Other Ambulatory Visit: Payer: Self-pay

## 2023-10-20 ENCOUNTER — Other Ambulatory Visit (HOSPITAL_COMMUNITY): Payer: Self-pay

## 2023-10-20 ENCOUNTER — Other Ambulatory Visit: Payer: Self-pay

## 2023-10-26 ENCOUNTER — Other Ambulatory Visit: Payer: Self-pay

## 2023-10-26 ENCOUNTER — Other Ambulatory Visit (HOSPITAL_COMMUNITY): Payer: Self-pay

## 2023-11-02 ENCOUNTER — Other Ambulatory Visit (HOSPITAL_BASED_OUTPATIENT_CLINIC_OR_DEPARTMENT_OTHER): Payer: Self-pay

## 2023-11-02 ENCOUNTER — Other Ambulatory Visit (HOSPITAL_COMMUNITY): Payer: Self-pay

## 2023-11-02 MED ORDER — OMEPRAZOLE 40 MG PO CPDR
40.0000 mg | DELAYED_RELEASE_CAPSULE | Freq: Two times a day (BID) | ORAL | 3 refills | Status: AC
  Filled 2023-11-02: qty 180, 90d supply, fill #0
  Filled 2024-01-25: qty 180, 90d supply, fill #1
  Filled 2024-04-21: qty 180, 90d supply, fill #2

## 2023-11-15 ENCOUNTER — Other Ambulatory Visit (HOSPITAL_COMMUNITY): Payer: Self-pay

## 2023-11-15 ENCOUNTER — Other Ambulatory Visit: Payer: Self-pay

## 2023-11-15 MED ORDER — ALPRAZOLAM 1 MG PO TABS
0.5000 mg | ORAL_TABLET | Freq: Three times a day (TID) | ORAL | 3 refills | Status: DC | PRN
  Filled 2023-11-15 (×2): qty 45, 30d supply, fill #0
  Filled 2023-12-15: qty 45, 30d supply, fill #1
  Filled 2024-01-10 – 2024-01-14 (×2): qty 45, 30d supply, fill #2
  Filled 2024-02-08 – 2024-02-11 (×3): qty 45, 30d supply, fill #3

## 2023-11-19 DIAGNOSIS — T7840XA Allergy, unspecified, initial encounter: Secondary | ICD-10-CM | POA: Diagnosis not present

## 2023-11-20 ENCOUNTER — Other Ambulatory Visit (HOSPITAL_COMMUNITY): Payer: Self-pay

## 2023-12-04 ENCOUNTER — Other Ambulatory Visit (HOSPITAL_COMMUNITY): Payer: Self-pay

## 2023-12-04 ENCOUNTER — Other Ambulatory Visit: Payer: Self-pay

## 2023-12-06 ENCOUNTER — Other Ambulatory Visit: Payer: Self-pay

## 2023-12-15 ENCOUNTER — Other Ambulatory Visit (HOSPITAL_COMMUNITY): Payer: Self-pay

## 2023-12-15 ENCOUNTER — Other Ambulatory Visit: Payer: Self-pay

## 2023-12-22 ENCOUNTER — Other Ambulatory Visit (HOSPITAL_COMMUNITY): Payer: Self-pay

## 2023-12-22 DIAGNOSIS — F1120 Opioid dependence, uncomplicated: Secondary | ICD-10-CM | POA: Diagnosis not present

## 2023-12-22 DIAGNOSIS — M47816 Spondylosis without myelopathy or radiculopathy, lumbar region: Secondary | ICD-10-CM | POA: Diagnosis not present

## 2023-12-22 DIAGNOSIS — M5412 Radiculopathy, cervical region: Secondary | ICD-10-CM | POA: Diagnosis not present

## 2023-12-22 MED ORDER — HYDROCODONE-ACETAMINOPHEN 5-325 MG PO TABS
1.0000 | ORAL_TABLET | ORAL | 0 refills | Status: AC | PRN
  Filled 2024-02-08: qty 60, 20d supply, fill #0

## 2023-12-22 MED ORDER — HYDROCODONE-ACETAMINOPHEN 5-325 MG PO TABS
1.0000 | ORAL_TABLET | ORAL | 0 refills | Status: AC | PRN
  Filled 2024-01-10: qty 60, 20d supply, fill #0

## 2023-12-22 MED ORDER — HYDROCODONE-ACETAMINOPHEN 5-325 MG PO TABS
1.0000 | ORAL_TABLET | ORAL | 0 refills | Status: AC | PRN
  Filled 2024-03-15: qty 60, 20d supply, fill #0

## 2023-12-29 ENCOUNTER — Other Ambulatory Visit: Payer: Self-pay

## 2024-01-10 ENCOUNTER — Other Ambulatory Visit (HOSPITAL_COMMUNITY): Payer: Self-pay

## 2024-01-10 ENCOUNTER — Other Ambulatory Visit: Payer: Self-pay

## 2024-01-14 ENCOUNTER — Other Ambulatory Visit: Payer: Self-pay

## 2024-01-14 ENCOUNTER — Other Ambulatory Visit (HOSPITAL_COMMUNITY): Payer: Self-pay

## 2024-01-18 ENCOUNTER — Other Ambulatory Visit (HOSPITAL_COMMUNITY): Payer: Self-pay

## 2024-01-25 ENCOUNTER — Other Ambulatory Visit (HOSPITAL_COMMUNITY): Payer: Self-pay

## 2024-02-08 ENCOUNTER — Other Ambulatory Visit: Payer: Self-pay

## 2024-02-08 ENCOUNTER — Encounter (HOSPITAL_COMMUNITY): Payer: Self-pay

## 2024-02-08 ENCOUNTER — Other Ambulatory Visit (HOSPITAL_COMMUNITY): Payer: Self-pay

## 2024-02-10 ENCOUNTER — Other Ambulatory Visit (HOSPITAL_COMMUNITY): Payer: Self-pay

## 2024-02-10 ENCOUNTER — Other Ambulatory Visit: Payer: Self-pay

## 2024-02-24 ENCOUNTER — Other Ambulatory Visit (HOSPITAL_COMMUNITY): Payer: Self-pay

## 2024-02-28 ENCOUNTER — Other Ambulatory Visit: Payer: Self-pay

## 2024-02-28 ENCOUNTER — Other Ambulatory Visit (HOSPITAL_COMMUNITY): Payer: Self-pay

## 2024-02-28 MED ORDER — EZETIMIBE 10 MG PO TABS
10.0000 mg | ORAL_TABLET | Freq: Every day | ORAL | 3 refills | Status: AC
  Filled 2024-02-28: qty 90, 90d supply, fill #0
  Filled 2024-05-23: qty 90, 90d supply, fill #1

## 2024-03-06 ENCOUNTER — Other Ambulatory Visit (HOSPITAL_COMMUNITY): Payer: Self-pay

## 2024-03-06 ENCOUNTER — Other Ambulatory Visit: Payer: Self-pay

## 2024-03-06 MED ORDER — ALPRAZOLAM 1 MG PO TABS
0.5000 mg | ORAL_TABLET | Freq: Three times a day (TID) | ORAL | 0 refills | Status: DC | PRN
  Filled 2024-03-15: qty 45, 30d supply, fill #0

## 2024-03-07 ENCOUNTER — Other Ambulatory Visit (HOSPITAL_COMMUNITY): Payer: Self-pay

## 2024-03-07 DIAGNOSIS — F1120 Opioid dependence, uncomplicated: Secondary | ICD-10-CM | POA: Diagnosis not present

## 2024-03-07 DIAGNOSIS — M542 Cervicalgia: Secondary | ICD-10-CM | POA: Diagnosis not present

## 2024-03-07 DIAGNOSIS — M47816 Spondylosis without myelopathy or radiculopathy, lumbar region: Secondary | ICD-10-CM | POA: Diagnosis not present

## 2024-03-07 MED ORDER — HYDROCODONE-ACETAMINOPHEN 5-325 MG PO TABS
1.0000 | ORAL_TABLET | Freq: Three times a day (TID) | ORAL | 0 refills | Status: AC | PRN
  Filled 2024-04-12: qty 60, 20d supply, fill #0

## 2024-03-07 MED ORDER — HYDROCODONE-ACETAMINOPHEN 5-325 MG PO TABS
1.0000 | ORAL_TABLET | Freq: Three times a day (TID) | ORAL | 0 refills | Status: AC | PRN
  Filled 2024-05-15: qty 60, 20d supply, fill #0

## 2024-03-08 DIAGNOSIS — G720 Drug-induced myopathy: Secondary | ICD-10-CM | POA: Diagnosis not present

## 2024-03-08 DIAGNOSIS — I10 Essential (primary) hypertension: Secondary | ICD-10-CM | POA: Diagnosis not present

## 2024-03-08 DIAGNOSIS — K219 Gastro-esophageal reflux disease without esophagitis: Secondary | ICD-10-CM | POA: Diagnosis not present

## 2024-03-08 DIAGNOSIS — Z0000 Encounter for general adult medical examination without abnormal findings: Secondary | ICD-10-CM | POA: Diagnosis not present

## 2024-03-08 DIAGNOSIS — G8929 Other chronic pain: Secondary | ICD-10-CM | POA: Diagnosis not present

## 2024-03-08 DIAGNOSIS — N182 Chronic kidney disease, stage 2 (mild): Secondary | ICD-10-CM | POA: Diagnosis not present

## 2024-03-08 DIAGNOSIS — Z125 Encounter for screening for malignant neoplasm of prostate: Secondary | ICD-10-CM | POA: Diagnosis not present

## 2024-03-08 DIAGNOSIS — K589 Irritable bowel syndrome without diarrhea: Secondary | ICD-10-CM | POA: Diagnosis not present

## 2024-03-08 DIAGNOSIS — E782 Mixed hyperlipidemia: Secondary | ICD-10-CM | POA: Diagnosis not present

## 2024-03-08 DIAGNOSIS — M5090 Cervical disc disorder, unspecified, unspecified cervical region: Secondary | ICD-10-CM | POA: Diagnosis not present

## 2024-03-08 DIAGNOSIS — N400 Enlarged prostate without lower urinary tract symptoms: Secondary | ICD-10-CM | POA: Diagnosis not present

## 2024-03-15 ENCOUNTER — Other Ambulatory Visit (HOSPITAL_COMMUNITY): Payer: Self-pay

## 2024-03-15 ENCOUNTER — Other Ambulatory Visit: Payer: Self-pay

## 2024-03-21 ENCOUNTER — Other Ambulatory Visit: Payer: Self-pay

## 2024-03-30 ENCOUNTER — Other Ambulatory Visit (HOSPITAL_COMMUNITY): Payer: Self-pay

## 2024-04-03 ENCOUNTER — Other Ambulatory Visit (HOSPITAL_COMMUNITY): Payer: Self-pay

## 2024-04-12 ENCOUNTER — Other Ambulatory Visit (HOSPITAL_COMMUNITY): Payer: Self-pay

## 2024-04-12 ENCOUNTER — Other Ambulatory Visit: Payer: Self-pay

## 2024-04-12 MED ORDER — ALPRAZOLAM 1 MG PO TABS
0.5000 mg | ORAL_TABLET | Freq: Three times a day (TID) | ORAL | 0 refills | Status: DC | PRN
  Filled 2024-04-12: qty 45, 30d supply, fill #0

## 2024-04-21 ENCOUNTER — Other Ambulatory Visit: Payer: Self-pay

## 2024-04-21 ENCOUNTER — Other Ambulatory Visit (HOSPITAL_COMMUNITY): Payer: Self-pay

## 2024-05-01 ENCOUNTER — Other Ambulatory Visit (HOSPITAL_COMMUNITY): Payer: Self-pay

## 2024-05-01 MED ORDER — CYCLOBENZAPRINE HCL 10 MG PO TABS
10.0000 mg | ORAL_TABLET | Freq: Three times a day (TID) | ORAL | 11 refills | Status: AC | PRN
  Filled 2024-05-01: qty 90, 30d supply, fill #0
  Filled 2024-05-31: qty 90, 30d supply, fill #1
  Filled 2024-06-28: qty 270, 90d supply, fill #2

## 2024-05-08 ENCOUNTER — Other Ambulatory Visit (HOSPITAL_COMMUNITY): Payer: Self-pay

## 2024-05-08 ENCOUNTER — Other Ambulatory Visit: Payer: Self-pay

## 2024-05-09 ENCOUNTER — Other Ambulatory Visit (HOSPITAL_COMMUNITY): Payer: Self-pay

## 2024-05-09 MED ORDER — ALPRAZOLAM 1 MG PO TABS
0.5000 mg | ORAL_TABLET | Freq: Three times a day (TID) | ORAL | 0 refills | Status: DC | PRN
  Filled 2024-05-15: qty 45, 30d supply, fill #0

## 2024-05-15 ENCOUNTER — Other Ambulatory Visit (HOSPITAL_COMMUNITY): Payer: Self-pay

## 2024-05-15 ENCOUNTER — Other Ambulatory Visit: Payer: Self-pay

## 2024-05-23 ENCOUNTER — Other Ambulatory Visit: Payer: Self-pay

## 2024-05-31 ENCOUNTER — Other Ambulatory Visit (HOSPITAL_COMMUNITY): Payer: Self-pay

## 2024-05-31 MED ORDER — HYDROCODONE-ACETAMINOPHEN 5-325 MG PO TABS
1.0000 | ORAL_TABLET | Freq: Three times a day (TID) | ORAL | 0 refills | Status: AC | PRN
  Filled 2024-06-15: qty 60, 20d supply, fill #0

## 2024-05-31 MED ORDER — HYDROCODONE-ACETAMINOPHEN 5-325 MG PO TABS: 1.0000 | ORAL_TABLET | Freq: Three times a day (TID) | ORAL | 0 refills | Status: AC | PRN

## 2024-06-07 ENCOUNTER — Other Ambulatory Visit: Payer: Self-pay

## 2024-06-15 ENCOUNTER — Other Ambulatory Visit (HOSPITAL_COMMUNITY): Payer: Self-pay

## 2024-06-15 ENCOUNTER — Other Ambulatory Visit (HOSPITAL_BASED_OUTPATIENT_CLINIC_OR_DEPARTMENT_OTHER): Payer: Self-pay

## 2024-06-15 ENCOUNTER — Other Ambulatory Visit: Payer: Self-pay

## 2024-06-16 ENCOUNTER — Other Ambulatory Visit: Payer: Self-pay

## 2024-06-16 ENCOUNTER — Other Ambulatory Visit (HOSPITAL_COMMUNITY): Payer: Self-pay

## 2024-06-16 MED ORDER — ALPRAZOLAM 1 MG PO TABS
0.5000 mg | ORAL_TABLET | Freq: Three times a day (TID) | ORAL | 0 refills | Status: AC | PRN
  Filled 2024-06-16: qty 45, 30d supply, fill #0

## 2024-06-16 MED ORDER — BETAMETHASONE DIPROPIONATE 0.05 % EX CREA
TOPICAL_CREAM | Freq: Every day | CUTANEOUS | 3 refills | Status: AC
  Filled 2024-06-16: qty 15, 30d supply, fill #0

## 2024-06-28 ENCOUNTER — Other Ambulatory Visit: Payer: Self-pay

## 2024-06-28 ENCOUNTER — Other Ambulatory Visit (HOSPITAL_COMMUNITY): Payer: Self-pay

## 2024-06-30 ENCOUNTER — Other Ambulatory Visit (HOSPITAL_COMMUNITY): Payer: Self-pay

## 2024-06-30 MED ORDER — TAMSULOSIN HCL 0.4 MG PO CAPS
0.8000 mg | ORAL_CAPSULE | Freq: Every day | ORAL | 11 refills | Status: AC
  Filled 2024-06-30: qty 60, 60d supply, fill #0
  Filled 2024-07-01: qty 180, 90d supply, fill #0

## 2024-07-01 ENCOUNTER — Other Ambulatory Visit (HOSPITAL_COMMUNITY): Payer: Self-pay
